# Patient Record
Sex: Female | Born: 1950 | Race: Asian | Hispanic: No | Smoking: Never smoker
Health system: Southern US, Community
[De-identification: ages and names within clinical notes are randomized; demographics above are authoritative.]

## PROBLEM LIST (undated history)

## (undated) DIAGNOSIS — E559 Vitamin D deficiency, unspecified: Secondary | ICD-10-CM

## (undated) DIAGNOSIS — M810 Age-related osteoporosis without current pathological fracture: Secondary | ICD-10-CM

## (undated) DIAGNOSIS — E890 Postprocedural hypothyroidism: Secondary | ICD-10-CM

## (undated) DIAGNOSIS — G43909 Migraine, unspecified, not intractable, without status migrainosus: Secondary | ICD-10-CM

## (undated) DIAGNOSIS — F32A Depression, unspecified: Secondary | ICD-10-CM

## (undated) DIAGNOSIS — J45909 Unspecified asthma, uncomplicated: Secondary | ICD-10-CM

## (undated) DIAGNOSIS — E042 Nontoxic multinodular goiter: Secondary | ICD-10-CM

## (undated) HISTORY — DX: Unspecified asthma, uncomplicated: J45.909

## (undated) HISTORY — DX: Age-related osteoporosis without current pathological fracture: M81.0

## (undated) HISTORY — DX: Nontoxic multinodular goiter: E04.2

## (undated) HISTORY — DX: Vitamin D deficiency, unspecified: E55.9

## (undated) HISTORY — DX: Migraine, unspecified, not intractable, without status migrainosus: G43.909

## (undated) HISTORY — DX: Depression, unspecified: F32.A

## (undated) HISTORY — PX: LAPAROSCOPIC HYSTERECTOMY: SHX1926

## (undated) HISTORY — DX: Postprocedural hypothyroidism: E89.0

---

## 1998-12-09 ENCOUNTER — Encounter: Payer: Self-pay | Admitting: Obstetrics and Gynecology

## 1998-12-09 ENCOUNTER — Ambulatory Visit (HOSPITAL_COMMUNITY): Admission: RE | Admit: 1998-12-09 | Discharge: 1998-12-09 | Payer: Self-pay | Admitting: Obstetrics and Gynecology

## 2000-05-28 ENCOUNTER — Other Ambulatory Visit: Admission: RE | Admit: 2000-05-28 | Discharge: 2000-05-28 | Payer: Self-pay | Admitting: Obstetrics and Gynecology

## 2000-06-04 ENCOUNTER — Ambulatory Visit (HOSPITAL_COMMUNITY): Admission: RE | Admit: 2000-06-04 | Discharge: 2000-06-04 | Payer: Self-pay | Admitting: Obstetrics and Gynecology

## 2000-06-04 ENCOUNTER — Encounter: Payer: Self-pay | Admitting: Obstetrics and Gynecology

## 2001-08-11 ENCOUNTER — Other Ambulatory Visit: Admission: RE | Admit: 2001-08-11 | Discharge: 2001-08-11 | Payer: Self-pay | Admitting: Obstetrics and Gynecology

## 2002-10-12 ENCOUNTER — Other Ambulatory Visit: Admission: RE | Admit: 2002-10-12 | Discharge: 2002-10-12 | Payer: Self-pay | Admitting: Obstetrics and Gynecology

## 2002-10-21 ENCOUNTER — Encounter: Payer: Self-pay | Admitting: Obstetrics and Gynecology

## 2002-10-21 ENCOUNTER — Ambulatory Visit (HOSPITAL_COMMUNITY): Admission: RE | Admit: 2002-10-21 | Discharge: 2002-10-21 | Payer: Self-pay | Admitting: Obstetrics and Gynecology

## 2003-10-18 ENCOUNTER — Other Ambulatory Visit: Admission: RE | Admit: 2003-10-18 | Discharge: 2003-10-18 | Payer: Self-pay | Admitting: Obstetrics and Gynecology

## 2003-11-03 ENCOUNTER — Ambulatory Visit (HOSPITAL_COMMUNITY): Admission: RE | Admit: 2003-11-03 | Discharge: 2003-11-03 | Payer: Self-pay | Admitting: Obstetrics and Gynecology

## 2005-03-01 ENCOUNTER — Other Ambulatory Visit: Admission: RE | Admit: 2005-03-01 | Discharge: 2005-03-01 | Payer: Self-pay | Admitting: Obstetrics and Gynecology

## 2005-03-14 ENCOUNTER — Ambulatory Visit (HOSPITAL_COMMUNITY): Admission: RE | Admit: 2005-03-14 | Discharge: 2005-03-14 | Payer: Self-pay | Admitting: Obstetrics and Gynecology

## 2006-05-09 ENCOUNTER — Other Ambulatory Visit: Admission: RE | Admit: 2006-05-09 | Discharge: 2006-05-09 | Payer: Self-pay | Admitting: Obstetrics and Gynecology

## 2007-07-03 ENCOUNTER — Encounter (INDEPENDENT_AMBULATORY_CARE_PROVIDER_SITE_OTHER): Payer: Self-pay | Admitting: Obstetrics and Gynecology

## 2007-07-03 ENCOUNTER — Ambulatory Visit (HOSPITAL_COMMUNITY): Admission: RE | Admit: 2007-07-03 | Discharge: 2007-07-04 | Payer: Self-pay | Admitting: Obstetrics and Gynecology

## 2008-03-03 ENCOUNTER — Ambulatory Visit (HOSPITAL_COMMUNITY): Admission: RE | Admit: 2008-03-03 | Discharge: 2008-03-03 | Payer: Self-pay | Admitting: Obstetrics and Gynecology

## 2010-02-13 ENCOUNTER — Ambulatory Visit (HOSPITAL_COMMUNITY): Admission: RE | Admit: 2010-02-13 | Discharge: 2010-02-13 | Payer: Self-pay | Admitting: Obstetrics and Gynecology

## 2010-03-20 ENCOUNTER — Encounter: Admission: RE | Admit: 2010-03-20 | Discharge: 2010-03-20 | Payer: Self-pay | Admitting: Obstetrics and Gynecology

## 2011-05-08 NOTE — H&P (Signed)
NAMETOSHIBA, NULL           ACCOUNT NO.:  [id number]   MEDICAL RECORD NO.:  [id number]          PATIENT TYPE:  AMB   LOCATION:                                [facility]   PHYSICIAN:  Hal Morales, M.D.DATE OF BIRTH:  1951/09/29   DATE OF ADMISSION:  DATE OF DISCHARGE:                              HISTORY & PHYSICAL   Gabrielle Day is a 60 year old, married, Asian female, para 2-0-0-2, who  presents for laparoscopically assisted vaginal hysterectomy because of  symptomatic uterine fibroids and menorrhagia. For over a year, the  patient has complained of irregular bleeding which sometimes occurs  every two weeks and lasts for approximately 7 days. This bleeding  requires the change of  a pad five times a day. On occasion, however,  there were episodes that only required the change of 2 panty liners each  day, but that bleeding could last as long as 10 days. All bleeding was  accompanied by cramping which she rates as a 8/10 on a 10-point pain  scale, but she was able to find complete relief with ibuprofen 400 mg.  She denies any dyspareunia, nausea, vomiting, diarrhea, fever or  vaginitis symptoms but does admit to urinary frequency and nocturia. A  sonohysterogram done in June of 2007 showed an anterior fibroid  distorting the endometrium. However, it was not believed to be  submucosal. Additional findings of this study were a uterus measuring  8.83 x 7.33 cm x 5.7 cm with 3 measurable fibroids, 3.88 cm, 3.84 cm,  and 3.88 cm, respectively. The patient's ovaries appeared normal  bilaterally. A TSH and prolactin done at the same time was within normal  limits. Over the past year, the patient was placed on Depo-Provera which  initially controlled her bleeding by making her amenorrheic. After  several months, however, her bleeding resumed its sporadic nature and  flow, though it was much lighter. A hemoglobin in March of 2008 was  12.6. After several conversations on the  medical and surgical management  options for her symptoms to include Mirena IUD, endometrial ablations  and hysterectomy, the patient has chosen to proceed with hysterectomy.   PAST MEDICAL HISTORY:  OB history:  Gravida 2, para 2-0-0-2. The patient  had 2 spontaneous vaginal births.  GYN history:  Menarche 13 years ago, last menstrual period May 26, 2007.  The patient uses vasectomy as a method of contraception. Denies any  history of abnormal Pap smears or sexually transmitted disease. Last  normal Pap smear was May 2008. Last normal mammogram 2006.  Medical history:  Asthma. Migraine headaches.  Surgical history:  Negative. Denies any history of blood transfusions.   FAMILY HISTORY:  Migraine headaches, liver disease and Alzheimer's.   SOCIAL HISTORY:  The patient lives with her husband, and she is a  Fut[employer] trader.   HABITS:  She does not use alcohol or tobacco.   CURRENT MEDICATIONS:  1. Zyrtec 10 mg daily as needed.  2. Tylenol 2 tablets every 4 hours as needed.  3. Multivitamin 1 tablet daily.  4. Advil 2 tablets every 4 hours as needed.  5. Maxair 2  puffs every 6 hours as needed.  6. Calcium with vitamin D 1 tablet daily.   ALLERGIES:  PENICILLIN WHICH CAUSES A RASH.   REVIEW OF SYSTEMS:  The patient does wear glasses. She has seasonal  allergies, but she denies any chest pain, shortness of breath, skin  rashes, night sweats, chills or weight loss, and, accept as mentioned in  history of present illness, the remainder of the review of systems is  negative.   PHYSICAL EXAMINATION:  Blood pressure is 100/62, weight 122, height 5  feet 2-1/4 inches tall, pulse is 70.  NECK:  Is supple without masses. There is no thyromegaly or cervical  adenopathy.  HEART:  Regular rate and rhythm.  LUNGS:  Are clear.  BACK:  No CVA tenderness.  ABDOMEN:  No tenderness, masses or organomegaly.  EXTREMITIES:  No clubbing, cyanosis, or edema.  PELVIC EXAM:  EGBUS is within normal limits.  Vagina is rugose with mild  relaxation. There is brown discharge. Cervix nontender without lesions.  Uterus is retroflexed measuring 10-12 weeks' size, irregular but without  tenderness. Adnexa without tenderness or masses. Rectovaginal exam  without masses.   IMPRESSION:  1. Symptomatic uterine fibroids.  2. Menorrhagia.  3. Dysmenorrhea.   DISPOSITION:  A discussion was held with the patient regarding the  indications for her procedure along with its risks which include but are  not limited to reaction to anesthesia, damage to adjacent organs,  infection, excessive bleeding, and the possibility that her procedure  may require an abdominal incision. The patient verbalized understanding  of these risks and has consented to proceed with a laparoscopically  assisted vaginal hysterectomy with the possibility of a total abdominal  hysterectomy at [facility] [place] [facility] on July 03, 2007 at 10:30  a.m.      [place] J. Adline Peals.      Hal Morales, M.D.  Electronically Signed    EJP/MEDQ  D:  06/19/2007  T:  06/19/2007  Job:  [id number]

## 2011-05-08 NOTE — Discharge Summary (Signed)
Gabrielle Day, Gabrielle Day           ACCOUNT NO.:  [id number]   MEDICAL RECORD NO.:  [id number]          PATIENT TYPE:  OIB   LOCATION:  9303                          [facility]ILITY:  [facility]   PHYSICIAN:  Hal Morales, M.D.DATE OF BIRTH:  12-May-1951   DATE OF ADMISSION:  07/03/2007  DATE OF DISCHARGE:  07/04/2007                               DISCHARGE SUMMARY   ADMISSION DIAGNOSIS:  Menorrhagia, uterine fibroids.   DISCHARGE DIAGNOSES:  Menorrhagia, uterine fibroids.   PROCEDURE:  Laparoscopically-assisted vaginal hysterectomy.   HOSPITAL COURSE:  The patient was admitted to the hospital after having  undergone a laparoscopically-assisted vaginal hysterectomy, the details  of which are dictated in the operative report.  On the evening of  surgery, she was afebrile with adequate urine output and a benign  physical examination.  She was getting adequate relief from her PCA  Dilaudid.  On postoperative day #1, she had the vaginal packing removed  with minimal bleeding.  She began a regular diet and oral pain  medications.  Her postoperative laboratory work on post-op day #1  included a hemoglobin of 11.7 and a white blood cell count of 12.  She  was able to take a regular diet and ambulate in the halls.  She was  afebrile.  She was deemed to have received maximal hospital benefit and  be ready for discharge home.   DISCHARGE INSTRUCTIONS:  Printed instructions from [facility]  OB[facility].   DISCHARGE MEDICATIONS:  1. Ibuprofen 600 mg p.o. q.6 h. for 3 days, then as needed for pain.  2. Percocet 1 p.o. q.4 h. as needed for pain.  3. The patient is to resume her calcium 600 mg and vitamin D 400 mg      twice daily.  4. Zyrtec.  5. Maxair p.r.n.   FOLLOWUP:  The patient has a followup appointment in 6 weeks with Dr.  Pennie Rushing.   CONDITION ON DISCHARGE:  Recovering.      Hal Morales, M.D.  Electronically Signed     VPH/MEDQ  D:  07/04/2007  T:  07/04/2007  Job:   [id number]
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
## 2011-10-09 LAB — CBC
HCT: 40.9
Hemoglobin: 11.7 — ABNORMAL LOW
MCHC: 33.3
MCHC: 33.4
MCV: 89
Platelets: 217
RBC: 4.6
RDW: 12.5
WBC: 12 — ABNORMAL HIGH

## 2013-01-19 ENCOUNTER — Other Ambulatory Visit: Payer: Self-pay | Admitting: Obstetrics and Gynecology

## 2013-01-19 DIAGNOSIS — Z1231 Encounter for screening mammogram for malignant neoplasm of breast: Secondary | ICD-10-CM

## 2013-02-09 ENCOUNTER — Ambulatory Visit (HOSPITAL_COMMUNITY)
Admission: RE | Admit: 2013-02-09 | Discharge: 2013-02-09 | Disposition: A | Discharge status: Home / Self Care | Payer: Federal, State, Local not specified - PPO | Source: Ambulatory Visit | Attending: Obstetrics and Gynecology | Admitting: Obstetrics and Gynecology

## 2013-02-09 DIAGNOSIS — Z1231 Encounter for screening mammogram for malignant neoplasm of breast: Secondary | ICD-10-CM | POA: Insufficient documentation

## 2013-02-19 ENCOUNTER — Encounter: Payer: Self-pay | Admitting: Obstetrics and Gynecology

## 2013-02-19 DIAGNOSIS — Z90710 Acquired absence of both cervix and uterus: Secondary | ICD-10-CM

## 2013-02-19 DIAGNOSIS — R922 Inconclusive mammogram: Secondary | ICD-10-CM

## 2013-02-19 HISTORY — DX: Acquired absence of both cervix and uterus: Z90.710

## 2015-10-14 ENCOUNTER — Other Ambulatory Visit: Payer: Self-pay | Admitting: Obstetrics and Gynecology

## 2015-10-14 ENCOUNTER — Ambulatory Visit
Admission: RE | Admit: 2015-10-14 | Discharge: 2015-10-14 | Disposition: A | Discharge status: Home / Self Care | Payer: Federal, State, Local not specified - PPO | Source: Ambulatory Visit | Attending: Obstetrics and Gynecology | Admitting: Obstetrics and Gynecology

## 2015-10-14 DIAGNOSIS — M810 Age-related osteoporosis without current pathological fracture: Secondary | ICD-10-CM

## 2015-12-20 ENCOUNTER — Ambulatory Visit: Payer: Federal, State, Local not specified - PPO | Attending: Obstetrics and Gynecology | Admitting: Physical Therapy

## 2015-12-20 ENCOUNTER — Ambulatory Visit: Payer: Federal, State, Local not specified - PPO

## 2015-12-20 DIAGNOSIS — R29898 Other symptoms and signs involving the musculoskeletal system: Secondary | ICD-10-CM | POA: Diagnosis present

## 2015-12-20 DIAGNOSIS — M810 Age-related osteoporosis without current pathological fracture: Secondary | ICD-10-CM | POA: Insufficient documentation

## 2015-12-20 NOTE — Patient Instructions (Signed)
Bridging    Slowly raise buttocks from floor, keeping stomach tight.  Hold 5 seconds Repeat __10__ times per set. Do _1___ sets per session. Do _1-2___ sessions per day.  http://orth.exer.us/1097   Copyright  VHI. All rights reserved.    Balance, Proprioception: Hip Abduction With Tubing    With tubing attached to ankle of uninvolved leg, swing leg to side. Return. Repeat __10_ times. Do _1-2___ sessions per day.  http://cc.exer.us/20   Copyright  VHI. All rights reserved.   Balance, Proprioception: Hip Extension With Tubing    With tubing attached to ankle around leg, swing leg back. Return. Repeat _10___ times. Do __1-2__ sessions per day.  http://cc.exer.us/19   Copyright  VHI. All rights reserved.   Balance, Proprioception: Hip Adduction With Tubing    With tubing attached to ankle, cross over. Return. Repeat __10__ times. Do _1-2___ sessions per day.  http://cc.exer.us/21   Copyright  VHI. All rights reserved.   Balance, Proprioception: Hip Flexion With Tubing    With tubing attached to ankle, swing leg forward. Return. Repeat __10__ times. Do _1-2___ sessions per day.  http://cc.exer.us/18   Copyright  VHI. All rights reserved.   Heel Raises    Stand with support. With knees straight, raise heels off ground. Hold _1-2__ seconds. Relax for _1-2__ seconds. Repeat _10__ times. Do __1-2_ times a day.  Copyright  VHI. All rights reserved.       Machines safe to use at the fitness center:  1. Leg Press 2. Hip abduction/adduction machine 3. Hamstring curls 4. Knee extension

## 2015-12-20 NOTE — Therapy (Addendum)
[facility] [address]  [place], [place], [address] Phone: [phone]   Fax:  [phone]  Physical Therapy Evaluation  Patient Details  Name: Gabrielle Day MRN: [id number] Date of Birth: 27-Feb-1951 Referring Provider: Dr. Kendall Flack  Encounter Date: 12/20/2015      PT End of Session - 12/20/15 1046    Visit Number 1   Number of Visits 4   Date for PT Re-Evaluation 01/17/16   PT Start Time 0800   PT Stop Time 0842   PT Time Calculation (min) 42 min   Activity Tolerance Patient tolerated treatment well   Behavior During Therapy [facility] for tasks assessed/performed      No past medical history on file.  No past surgical history on file.  There were no vitals filed for this visit.  Visit Diagnosis:  Weakness of both lower extremities - Plan: PT plan of care cert/re-cert  Osteoporosis - Plan: PT plan of care cert/re-cert      Subjective Assessment - 12/20/15 0802    Subjective Pt is a 63 y/o who presents to OPPT for osteoporosis and need to establish exercises program. Pt currently with limited exercise; but reports trying to walk as much as possible but has been limited due to cold.     Patient Stated Goals establish program and teach pt right way to exercise   Currently in Pain? No/denies  reports occasional R hip pain; will monitor but will not directly address            [facility] PT Assessment - 12/20/15 0806    Assessment   Medical Diagnosis osteoporosis   Referring Provider Dr. Kendall Flack   Onset Date/Surgical Date --  progressive   Next MD Visit PRN   Prior Therapy none   Precautions   Precautions None   Restrictions   Weight Bearing Restrictions No   Balance Screen   Has the patient fallen in the past 6 months No   Has the patient had a decrease in activity level because of a fear of falling?  No   Is the patient reluctant to leave their home because of a fear of falling?   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   Leisure shopping; walking   Cognition   Overall Cognitive Status Within Functional Limits for tasks assessed   Observation/Other Assessments   Focus on Therapeutic Outcomes (FOTO)  not completed as likely 1 time visit   Strength   Strength Assessment Site Hip;Knee;Ankle   Right Hip Flexion 3/5   Right Hip Extension 3/5   Right Hip ABduction 3+/5   Right Hip ADduction 3+/5   Left Hip Flexion 3/5   Left Hip Extension 3/5   Left Hip ABduction 3+/5   Left Hip ADduction 3+/5   Right/Left Knee Right;Left   Right Knee Flexion 4/5   Right Knee Extension 5/5   Left Knee Flexion 4/5   Left Knee Extension 5/5                   [facility] Adult PT Treatment/Exercise - 12/20/15 1042    Self-Care   Other Self-Care Comments  pt performed 1-5 reps of HEP and available fitness equipment to demonstrate and return demonstration of proper technique.  Pt lives in [place], [place] and is returning to [place] today and unsure when she will be returning.   Exercises   Exercises Knee/Hip  Knee/Hip Exercises: Aerobic   Elliptical x 1 min for demonstration of proper use   Knee/Hip Exercises: Machines for Strengthening   Cybex Knee Extension 20# x 5 reps for demonstration   Cybex Knee Flexion 20# x 5 reps for demonstration   Cybex Leg Press 25# x 3 reps for demonstration   Knee/Hip Exercises: Standing   Other Standing Knee Exercises standing hip 4 way with green theraband x 3-5 reps each direction for demonstration                PT Education - 12/20/15 1045    Education provided Yes   Education Details HEP and fitness center equipment recommendations   Person(s) Educated Patient   Methods Explanation;Demonstration;Handout;Verbal cues   Comprehension Verbalized understanding;Returned demonstration;Need further instruction             PT Long Term Goals - 12/20/15 1051    PT LONG TERM GOAL #1   Title  independent with HEP (01/17/16)   Time 4   Period Weeks   Status New   PT LONG TERM GOAL #2   Title report compliance with HEP and fitness center attendance to improve strength (01/17/16)   Time 4   Period Weeks   Status New               Plan - 12/20/15 1046    Clinical Impression Statement Pt is a 64 y/o female who presents to OPPT with dx of osteoporosis.  Pt demonstrated decreased strength in bil lower extremities and concern for injury if pt falls.  Pt lives in [place], [place] with plans to return today.  Pt unsure when she will be returning to [place], so session focused on home exercises and fitness center exercises.  Advised pt to have assistance when using fitness center equipment for first time to decrease risk of injury.  If pt returns will plan to continue strengthening and overall functional mobility.     Pt will benefit from skilled therapeutic intervention in order to improve on the following deficits Decreased strength;Decreased mobility   Rehab Potential Good   PT Frequency 1x / week   PT Duration Other (comment)  pt possibly to return in ~ 4 weeks   PT Treatment/Interventions Therapeutic exercise;Therapeutic activities;ADLs/Self Care Home Management;Balance training;Patient/family education   PT Next Visit Plan progress HEP   Consulted and Agree with Plan of Care Patient         Problem List Patient Active Problem List   Diagnosis Date Noted  . Dense breasts 02/19/2013  . S/P hysterectomy 02/19/2013   Laureen Abrahams, PT, DPT 12/20/2015 10:55 AM  [facility] [address]  [place], [place], [address] Phone: [phone]   Fax:  [phone]  Name: Gabrielle Day MRN: [id number] Date of Birth: 24-Sep-1951     PHYSICAL THERAPY DISCHARGE SUMMARY  Visits from Start of Care: 1  Current functional level related to goals / functional outcomes: See above; pt did not return to PT.  Pt lives  in [place] and was unsure if she would be returning to [facility] during the [place].  Provided pt with referral for option to receive services in [place].   Remaining deficits: Unknown as pt did not return   Education / Equipment: HEP; community fitness  Plan: Patient agrees to discharge.  Patient goals were not met. Patient is being discharged due to not returning since the last visit.  ?????    Laureen Abrahams, PT,  DPT 02/09/2016 9:09 AM  [facility] [place], [place] [address]  [phone] (office) [phone] (fax)

## 2016-01-09 ENCOUNTER — Ambulatory Visit: Payer: Federal, State, Local not specified - PPO

## 2017-04-26 IMAGING — CR DG LUMBAR SPINE COMPLETE 4+V
5 series · 5 of 5 positions shown · non-contrast
Comparison: None.

CLINICAL DATA: Osteoporosis.  Rule out fracture.

EXAM:
LUMBAR SPINE - COMPLETE 4+ VIEW

[w lumbar spine ap]
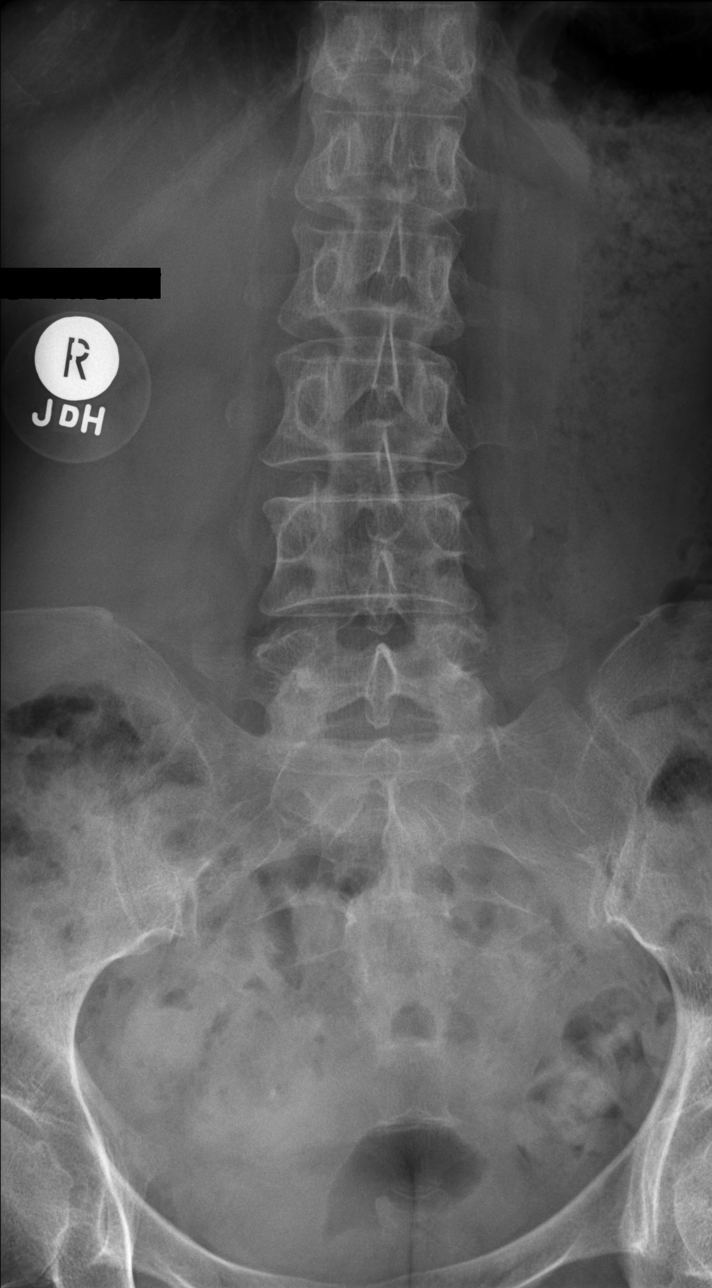

[w lumbar spine obl (1 of 2)]
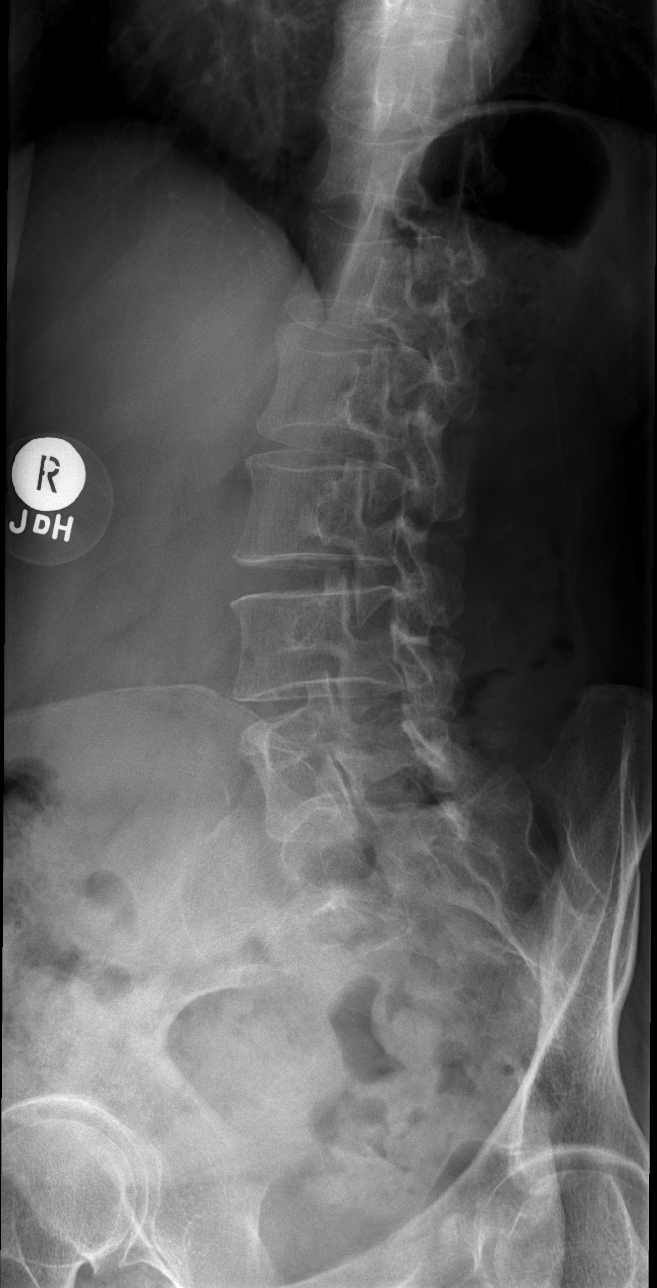

[w lumbar spine obl (2 of 2)]
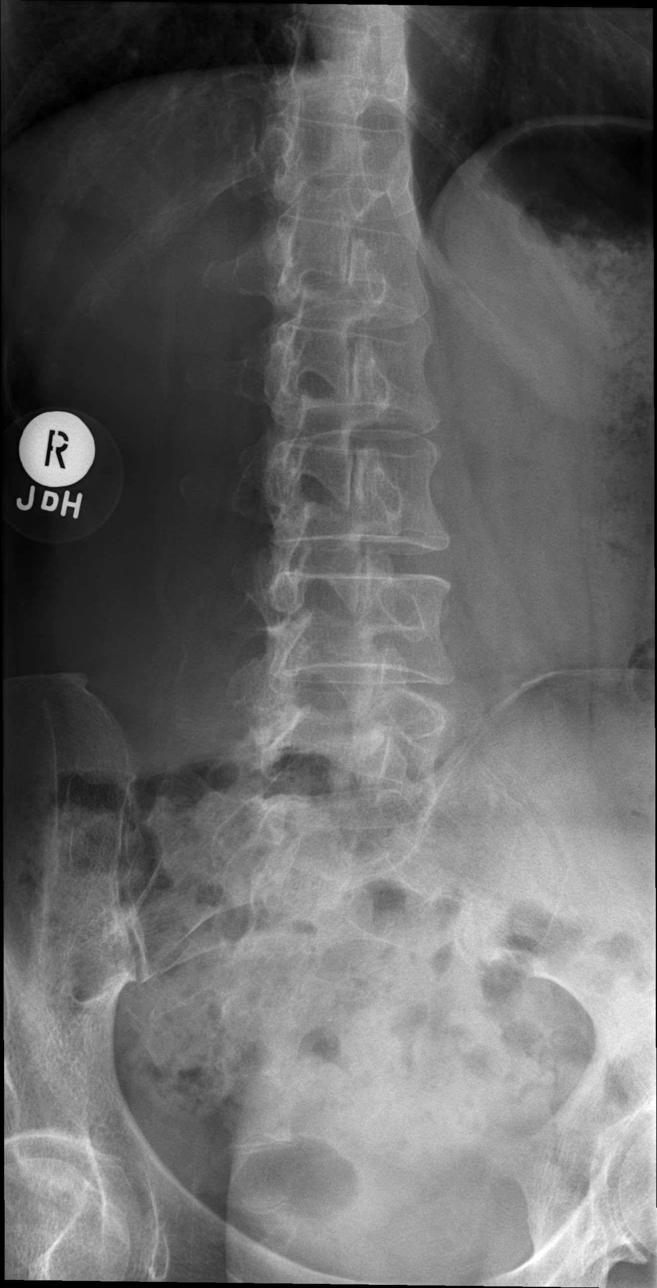

[w lumbar spine lat]
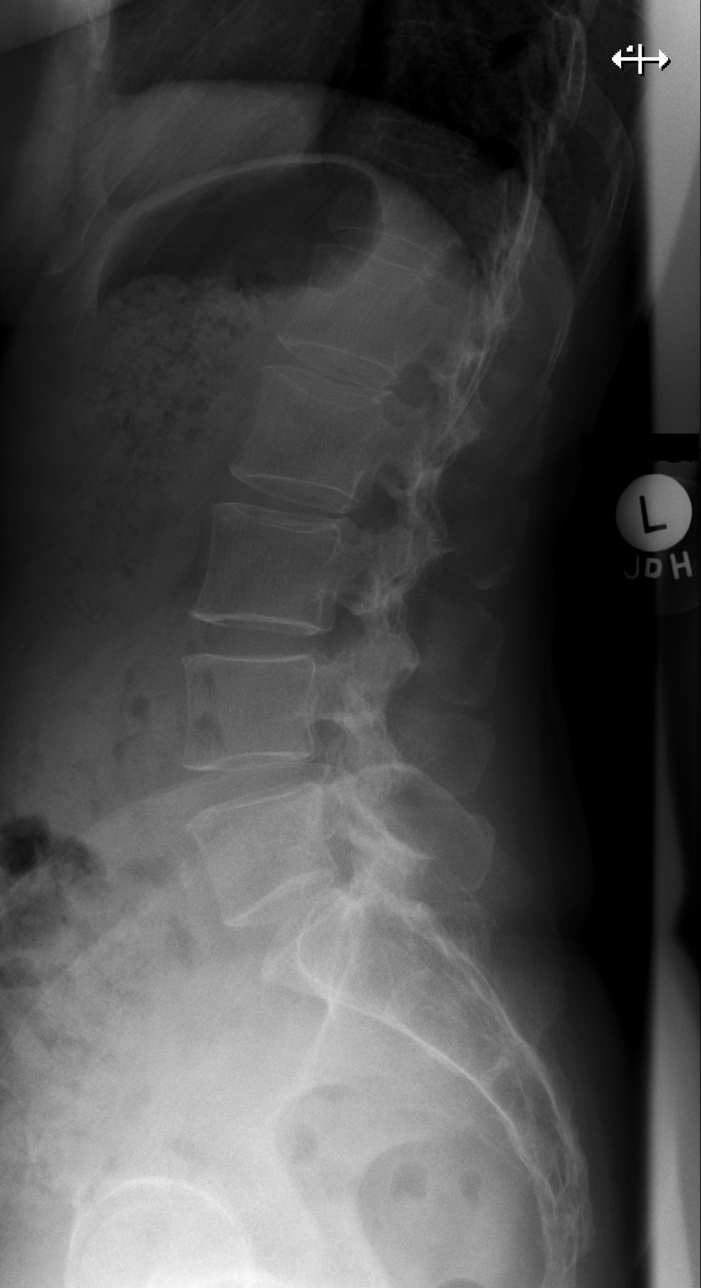

[w lumbar l-5 s-1 spot]
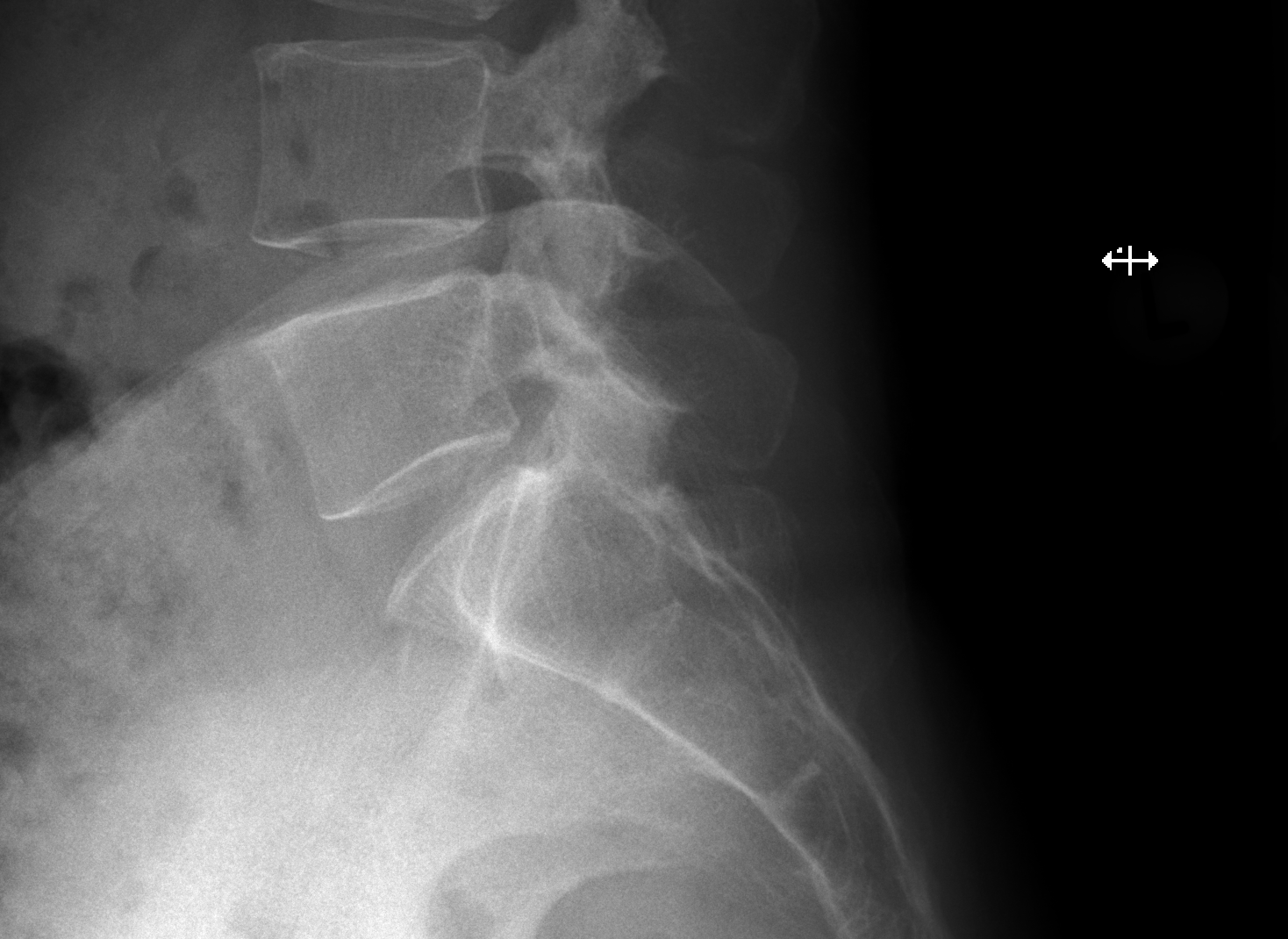

[5 of 5 positions shown; findings below may reference images not displayed]

FINDINGS: There is no evidence of lumbar spine fracture. Alignment is normal.
Intervertebral disc spaces are maintained.
IMPRESSION: Negative.

## 2019-06-02 HISTORY — PX: THYROIDECTOMY, PARTIAL: SHX18

## 2019-11-27 ENCOUNTER — Other Ambulatory Visit: Payer: Self-pay | Admitting: *Deleted

## 2019-11-27 DIAGNOSIS — Z20822 Contact with and (suspected) exposure to covid-19: Secondary | ICD-10-CM

## 2019-11-30 LAB — NOVEL CORONAVIRUS, NAA: SARS-CoV-2, NAA: NOT DETECTED

## 2019-12-25 HISTORY — PX: CATARACT EXTRACTION: SUR2

## 2021-02-20 LAB — HM PAP SMEAR: HM Pap smear: NEGATIVE

## 2021-07-10 LAB — COLOGUARD: Cologuard: NEGATIVE

## 2021-07-14 LAB — COLOGUARD: COLOGUARD: NEGATIVE

## 2022-06-19 ENCOUNTER — Telehealth: Payer: Self-pay | Admitting: Family

## 2022-06-19 ENCOUNTER — Other Ambulatory Visit (INDEPENDENT_AMBULATORY_CARE_PROVIDER_SITE_OTHER): Payer: Federal, State, Local not specified - PPO

## 2022-06-19 ENCOUNTER — Ambulatory Visit: Payer: Federal, State, Local not specified - PPO | Admitting: Family

## 2022-06-19 ENCOUNTER — Encounter: Payer: Self-pay | Admitting: Family

## 2022-06-19 VITALS — BP 123/71 | HR 59 | Temp 97.7°F | Resp 16 | Ht 63.0 in | Wt 115.4 lb

## 2022-06-19 DIAGNOSIS — E890 Postprocedural hypothyroidism: Secondary | ICD-10-CM

## 2022-06-19 DIAGNOSIS — E785 Hyperlipidemia, unspecified: Secondary | ICD-10-CM | POA: Insufficient documentation

## 2022-06-19 DIAGNOSIS — M810 Age-related osteoporosis without current pathological fracture: Secondary | ICD-10-CM | POA: Diagnosis not present

## 2022-06-19 DIAGNOSIS — E559 Vitamin D deficiency, unspecified: Secondary | ICD-10-CM | POA: Diagnosis not present

## 2022-06-19 DIAGNOSIS — J45909 Unspecified asthma, uncomplicated: Secondary | ICD-10-CM | POA: Diagnosis not present

## 2022-06-19 DIAGNOSIS — Z0000 Encounter for general adult medical examination without abnormal findings: Secondary | ICD-10-CM | POA: Insufficient documentation

## 2022-06-19 DIAGNOSIS — F32A Depression, unspecified: Secondary | ICD-10-CM

## 2022-06-19 DIAGNOSIS — G43909 Migraine, unspecified, not intractable, without status migrainosus: Secondary | ICD-10-CM

## 2022-06-19 LAB — LIPID PANEL
Cholesterol: 239 mg/dL — ABNORMAL HIGH (ref 0–200)
HDL: 77.6 mg/dL (ref >39.00)
LDL Cholesterol: 143 mg/dL — ABNORMAL HIGH (ref 0–99)
NonHDL: 161.79
Total CHOL/HDL Ratio: 3
Triglycerides: 95 mg/dL (ref 0.0–149.0)
VLDL: 19 mg/dL (ref 0.0–40.0)

## 2022-06-19 LAB — VITAMIN D 25 HYDROXY (VIT D DEFICIENCY, FRACTURES): VITD: 29.62 ng/mL — ABNORMAL LOW (ref 30.00–100.00)

## 2022-06-19 LAB — TSH: TSH: 2.18 u[IU]/mL (ref 0.35–5.50)

## 2022-06-19 MED ORDER — VITAMIN D3 75 MCG (3000 UT) PO TABS: 1.0000 | ORAL_TABLET | Freq: Every day | ORAL | Status: AC

## 2022-06-19 NOTE — Progress Notes (Signed)
Subjective:   By signing my name below, I, Cassell Clement, attest that this documentation has been prepared under the direction and in the presence of Birdie Sons, NP 06/19/2022    Patient ID: Gabrielle Day, female    DOB: 1951-04-24, 71 y.o.   MRN: [id number]  Chief Complaint  Patient presents with   New Patient (Initial Visit)    Relocated from [facility] 2 months ago    HPI Patient is in today for establishment of patient care.   Thyroid: She reports that she had partial removal of her thyroid. There was a nodule in her thyroid. She had a biopsy on both sides but states that the right side of her thyroid is worse than the left side. She reports that she gets an ultrasound every 6 months. Her last ultrasound was in 09/2021. She is interested in being referred to an endocrinologist in the area Depression: She states that as of this visit, she has no depression symptoms. She reports that at the time of her diagnosis, she was not taking medication Asthma: She states that her asthma worsens in the Autumn. She has an her Albuterol inhaler on her. She uses her 110 MCG/ACT Flovent year around. She takes 10 Mg of Singular PRN.   Urine: She reports that she urinates twice overnight typically.   Migraines: She reports that she occasionally has migraines. She takes tylenol and coffee to help alleviate symptoms. She states that some days she just needs coffee to relieve the symptoms  She denies having any fever, new muscle pain, joint pain , new moles, congestion, sinus pain, sore throat, chest pain, palpations, cough, SOB ,wheezing,n/v/d constipation, blood in stool, dysuria, frequency, hematuria, depression, anxiety, headaches at this time  Surgical History: She reports that she has had cataract surgery and partial removal of her tonsils.  Family History: She reports that her dad has Alzheimer's disease. She also reports that her mother passed but had no health issues. Her sisters and  brothers are relatively healthy. She believes her other brother passed away from a stroke. She states that her daughter is healthy and her son has asthma.  Colonoscopy: She reports that she never had a colonoscopy. She instead uses a Cologuard and reports no immediate symptoms Dexa: She has a history of osteopetrosis and reports that has been a long time she had a bone density exam. She prefers to get her exam done with her specialist Mammogram: She is following up with her mammograms with her gyn.  Immunizations: She reports that she received the Shingles, Pneumonia vaccine and the Bivalent booster vaccine.  Social: She states that she moved here from [place] two months ago. She has previously lived here before moving to [place]. She came back here because this location is her home. Her husband is currently in [place] so they alternate between [employer] and [place]. Both of her children currently live in [place], [place]. She currently has four grandchildren. She is currently not working. She grew up in [place] and moved to the [place] when she was 71 years old. She states her highest level of education is a bachelor's degree in history. She does not currently have any pets.    Health Maintenance Due  Topic Date Due   COVID-19 Vaccine (1) Never done   Hepatitis C Screening  Never done   TETANUS/TDAP  Never done   COLONOSCOPY (Pts 45-44yrs Insurance coverage will need to be confirmed)  Never done   Zoster Vaccines- Shingrix (1 of  2) Never done   MAMMOGRAM  02/09/2015   Pneumonia Vaccine 70+ Years old (1 - PCV) Never done   DEXA SCAN  Never done    Past Medical History:  Diagnosis Date   Asthma    Depression    Migraine    Osteoporosis    Post-surgical hypothyroidism    reports that she had a thyroid nodule   S/P hysterectomy 02/19/2013    Past Surgical History:  Procedure Laterality Date   CATARACT EXTRACTION Bilateral 2021   THYROIDECTOMY, PARTIAL Right 06/02/2019    Family History   Problem Relation Age of Onset   Alzheimer's disease Father    Stroke Brother    Asthma Son     Social History   Socioeconomic History   Marital status: Divorced    Spouse name: Visual merchandiser   Number of children: 2   Years of education: Not on file   Highest education level: Not on file  Occupational History   Not on file  Tobacco Use   Smoking status: Never   Smokeless tobacco: Never  Vaping Use   Vaping Use: Never used  Substance and Sexual Activity   Alcohol use: Never   Drug use: Never   Sexual activity: Yes    Partners: Male  Other Topics Concern   Not on file  Social History Narrative   Married   2 grown children, 4 grandchildren (live in [place])   Futures trader   Enjoys reading   Grew up in [place], moved to [place] at age 28   Bachelor degree in history   Social Determinants of Corporate investment banker Strain: Not on file  Food Insecurity: Not on file  Transportation Needs: Not on file  Physical Activity: Not on file  Stress: Not on file  Social Connections: Not on file  Intimate Partner Violence: Not on file    Outpatient Medications Prior to Visit  Medication Sig Dispense Refill   albuterol (VENTOLIN HFA) 108 (90 Base) MCG/ACT inhaler SMARTSIG:2 Puff(s) By Mouth Every 6 Hours     Azelastine HCl 137 MCG/SPRAY SOLN Place into the nose.     fluticasone (FLOVENT HFA) 110 MCG/ACT inhaler Inhale into the lungs 2 (two) times daily.     levothyroxine (SYNTHROID) 25 MCG tablet Take 25 mcg by mouth daily.     loratadine (CLARITIN) 10 MG tablet Take 10 mg by mouth daily.     montelukast (SINGULAIR) 10 MG tablet Take 10 mg by mouth daily.     No facility-administered medications prior to visit.    Allergies  Allergen Reactions   Penicillins     Review of Systems  Constitutional:  Negative for fever.  HENT:  Negative for congestion, sinus pain and sore throat.   Respiratory:  Negative for wheezing.   Cardiovascular:  Negative for palpitations.  Gastrointestinal:   Negative for blood in stool, constipation, diarrhea, nausea and vomiting.  Genitourinary:  Negative for dysuria, frequency and hematuria.  Musculoskeletal:  Negative for joint pain and myalgias.  Skin:        (-) [place] Moles  Neurological:  Negative for headaches.  Psychiatric/Behavioral:  Negative for depression. The patient is not nervous/anxious.        Objective:    Physical Exam Constitutional:      General: She is not in acute distress.    Appearance: Normal appearance. She is not ill-appearing.  HENT:     Head: Normocephalic and atraumatic.     Right Ear: Tympanic membrane, ear canal  and external ear normal.     Left Ear: Tympanic membrane, ear canal and external ear normal.  Eyes:     Extraocular Movements: Extraocular movements intact.     Pupils: Pupils are equal, round, and reactive to light.  Neck:     Thyroid: No thyromegaly.  Cardiovascular:     Rate and Rhythm: Normal rate and regular rhythm.     Heart sounds: Normal heart sounds. No murmur heard.    No gallop.  Pulmonary:     Effort: Pulmonary effort is normal. No respiratory distress.     Breath sounds: Normal breath sounds. No wheezing or rales.  Abdominal:     General: Bowel sounds are normal. There is no distension.     Palpations: Abdomen is soft.     Tenderness: There is no abdominal tenderness. There is no guarding.  Musculoskeletal:     Comments: 5/5 strength upper and lower extremeties  Lymphadenopathy:     Cervical: No cervical adenopathy.  Skin:    General: Skin is warm and dry.  Neurological:     Mental Status: She is alert and oriented to person, place, and time.     Deep Tendon Reflexes:     Reflex Scores:      Patellar reflexes are 2+ on the right side and 2+ on the left side. Psychiatric:        Mood and Affect: Mood normal.        Behavior: Behavior normal.        Judgment: Judgment normal.     BP 123/71 (BP Location: Right Arm, Patient Position: Sitting, Cuff Size: Small)   Pulse  (!) 59   Temp 97.7 F (36.5 C) (Oral)   Resp 16   Ht 5\' 3"  (1.6 m)   Wt 115 lb 6.4 oz (52.3 kg)   SpO2 100%   BMI 20.44 kg/m  Wt Readings from Last 3 Encounters:  06/19/22 115 lb 6.4 oz (52.3 kg)       Assessment & Plan:   Problem List Items Addressed This Visit       Unprioritized   Vitamin D deficiency    Has taken  2000 iu off/on recently. Requesting follow up vitamin D level.       Relevant Orders   Vitamin D (25 hydroxy)   Preventative health care - Primary    Weight is WNL.  She will do mammo/dexa with GYN.  Reports normal cologuard last year. Reports 5 covid shots as well as shingrix series.  Will request records from her previous PCP.        Post-surgical hypothyroidism    History is unclear, but it sounds like she has hx of nodules/biopsy in the past which led to partial thyroidectomy.  Recommended TSH/Thyroid US to start, she declines, prefers to discuss with endocrinology. Referral placed.       Relevant Medications   levothyroxine (SYNTHROID) 25 MCG tablet   Other Relevant Orders   Ambulatory referral to Endocrinology   Osteoporosis    She is reportedly due for a bone density test. She declines to complete here as she prefers to complete with her GYN, Dr. Pennie Rushing.       Migraine    Stable with prn use of tylenol/caffeine.       Hyperlipidemia    Reports that her cholesterol has been in the 220's typically.  Will check lipid panel today.       Relevant Orders   Lipid panel   Depression  Currently stable. States she has never required medication to treat her depression.       Asthma    Stable. Using flovent/singulair seasonally. Keeps albuterol on hand for prn use.       Relevant Medications   albuterol (VENTOLIN HFA) 108 (90 Base) MCG/ACT inhaler   montelukast (SINGULAIR) 10 MG tablet   fluticasone (FLOVENT HFA) 110 MCG/ACT inhaler     No orders of the defined types were placed in this encounter.   I, Lemont Fillers, NP,  personally preformed the services described in this documentation.  All medical record entries made by the scribe were at my direction and in my presence.  I have reviewed the chart and discharge instructions (if applicable) and agree that the record reflects my personal performance and is accurate and complete. 06/19/2022   I,Amber Collins,acting as a Neurosurgeon for Lemont Fillers, NP.,have documented all relevant documentation on the behalf of Lemont Fillers, NP,as directed by  Lemont Fillers, NP while in the presence of Lemont Fillers, NP.  Lemont Fillers, NP

## 2022-06-19 NOTE — Assessment & Plan Note (Signed)
Currently stable. States she has never required medication to treat her depression.

## 2022-06-19 NOTE — Assessment & Plan Note (Signed)
Has taken  2000 iu off/on recently. Requesting follow up vitamin D level.

## 2022-06-19 NOTE — Assessment & Plan Note (Signed)
She is reportedly due for a bone density test. She declines to complete here as she prefers to complete with her GYN, Dr. Pennie Rushing.

## 2022-06-19 NOTE — Assessment & Plan Note (Signed)
History is unclear, but it sounds like she has hx of nodules/biopsy in the past which led to partial thyroidectomy.  Recommended TSH/Thyroid US to start, she declines, prefers to discuss with endocrinology. Referral placed.

## 2022-06-19 NOTE — Assessment & Plan Note (Signed)
Stable with prn use of tylenol/caffeine.

## 2022-06-19 NOTE — Telephone Encounter (Signed)
TSH was ordered. Refills will be sent when we get results

## 2022-06-20 ENCOUNTER — Other Ambulatory Visit: Payer: Self-pay | Admitting: Family

## 2022-06-20 ENCOUNTER — Other Ambulatory Visit: Payer: Self-pay

## 2022-06-20 MED ORDER — LEVOTHYROXINE SODIUM 25 MCG PO TABS: 25.0000 ug | ORAL_TABLET | Freq: Every day | ORAL | 1 refills | Status: DC

## 2022-06-20 MED ORDER — AZELASTINE HCL 137 MCG/SPRAY NA SOLN: 2.0000 | Freq: Two times a day (BID) | NASAL | 5 refills | Status: DC

## 2022-06-20 MED ORDER — ALBUTEROL SULFATE HFA 108 (90 BASE) MCG/ACT IN AERS: INHALATION_SPRAY | RESPIRATORY_TRACT | 5 refills | Status: DC

## 2022-06-20 MED ORDER — FLUTICASONE PROPIONATE HFA 44 MCG/ACT IN AERO: 2.0000 | INHALATION_SPRAY | Freq: Two times a day (BID) | RESPIRATORY_TRACT | 5 refills | Status: DC

## 2022-06-20 MED ORDER — MONTELUKAST SODIUM 10 MG PO TABS: 10.0000 mg | ORAL_TABLET | Freq: Every day | ORAL | 5 refills | Status: DC

## 2022-06-20 NOTE — Telephone Encounter (Signed)
Patient advised of results, provider's comments and to increase vit d to 3000 iu.

## 2022-06-20 NOTE — Addendum Note (Signed)
Addended by: Sandford Craze on: 06/20/2022 04:08 PM   Modules accepted: Orders

## 2022-07-02 ENCOUNTER — Ambulatory Visit: Payer: Federal, State, Local not specified - PPO | Admitting: Family Medicine

## 2022-07-02 ENCOUNTER — Encounter: Payer: Self-pay | Admitting: Family Medicine

## 2022-07-02 VITALS — BP 110/57 | HR 74 | Temp 98.1°F | Ht 63.0 in | Wt 114.4 lb

## 2022-07-02 DIAGNOSIS — R059 Cough, unspecified: Secondary | ICD-10-CM | POA: Diagnosis not present

## 2022-07-02 DIAGNOSIS — J069 Acute upper respiratory infection, unspecified: Secondary | ICD-10-CM | POA: Diagnosis not present

## 2022-07-02 DIAGNOSIS — J029 Acute pharyngitis, unspecified: Secondary | ICD-10-CM | POA: Diagnosis not present

## 2022-07-02 LAB — POCT RAPID STREP A (OFFICE): Rapid Strep A Screen: NEGATIVE

## 2022-07-02 LAB — POC COVID19 BINAXNOW: SARS Coronavirus 2 Ag: NEGATIVE

## 2022-07-02 MED ORDER — GUAIFENESIN ER 600 MG PO TB12: 1200.0000 mg | ORAL_TABLET | Freq: Two times a day (BID) | ORAL | 2 refills | Status: DC

## 2022-07-02 MED ORDER — BENZONATATE 200 MG PO CAPS: 200.0000 mg | ORAL_CAPSULE | Freq: Two times a day (BID) | ORAL | 0 refills | Status: DC | PRN

## 2022-07-02 MED ORDER — FLUTICASONE PROPIONATE 50 MCG/ACT NA SUSP: 2.0000 | Freq: Every day | NASAL | 6 refills | Status: DC

## 2022-07-02 NOTE — Patient Instructions (Addendum)
COVID and Strep test was negative. This is likely a viral infection. Sending in mucinex, tessalon for cough, and nasal spray. Continue supportive measures including rest, hydration, humidifier use, steam showers, warm compresses to sinuses, warm liquids with lemon and honey, and over-the-counter cough, cold, and analgesics as needed.  If symptoms last 10 days without improving or if you get significantly worse before then, please let me know.   Patient Instructions  Medications & Home Remedies for Upper Respiratory Illness   Note: The following list assumes no pregnancy, normal liver & kidney function and no other drug interactions. All of these may not be appropriate for everyone. Always ask a pharmacist or qualified medical provider if you have any questions!    Aches/Pains, Fever, Headache OTC Acetaminophen (Tylenol) 500 mg tablets - take max 2 tablets (1000 mg) every 6 hours (4 times per day)  OTC Ibuprofen (Motrin) 200 mg tablets - take max 4 tablets (800 mg) every 6 hours   Sinus Congestion OTC Nasal Saline if desired to rinse OTC Oxymetolazone (Afrin, others) sparing use due to rebound congestion, NEVER use in kids. Do not use for more than 3 days.  OTC Phenylephrine (Sudafed) 10 mg tablets every 4 hours (or the 12-hour formulation) OTC Diphenhydramine (Benadryl) 25 mg tablets - take max 2 tablets every 4 hours   Cough & Sore Throat Prescription cough pills or syrups as directed OTC Dextromethorphan (Robitussin, others) - cough suppressant OTC Guaifenesin (Robitussin, Mucinex, others) - expectorant (helps cough up mucus) (Dextromethorphan and Guaifenesin also come in a combination tablet/syrup) OTC Lozenges w/ Benzocaine + Menthol (Cepacol) Honey - as much as you want! Teas which "coat the throat" - look for ingredients Elm Bark, Licorice Root, Marshmallow Root   Other OTC Zinc Lozenges within 24 hours of symptoms onset - mixed evidence this shortens the duration of the common  cold Don't waste your money on Vitamin C or Echinacea in acute illness - these are great for prevention, but once the illness has set in, it's too late!

## 2022-07-02 NOTE — Progress Notes (Signed)
Acute Office Visit  Subjective:     Patient ID: Gabrielle Day, female    DOB: 1951-09-14, 71 y.o.   MRN: [id number]  CC: URI symptoms   URI  This is a new problem. The current episode started in the past 7 days (3-4 days ago). The problem has been unchanged. There has been no fever. Associated symptoms include congestion, coughing, headaches, rhinorrhea, sinus pain, a sore throat and wheezing. Pertinent negatives include no abdominal pain, chest pain, diarrhea, dysuria, ear pain, joint pain, joint swelling, nausea, neck pain, plugged ear sensation, rash, sneezing, swollen glands or vomiting. She has tried inhaler use and antihistamine for the symptoms. The treatment provided mild relief.  Granddaughter has similar symptoms, but also running a fever.  COVID test negative yesterday at home  She is requesting COVID and Strep testing.    ROS: All review of systems negative except what is listed in the HPI       Objective:    BP (!) 110/57   Pulse 74   Temp 98.1 F (36.7 C)   Ht 5\' 3"  (1.6 m)   Wt 114 lb 6.4 oz (51.9 kg)   SpO2 99%   BMI 20.27 kg/m    Physical Exam Vitals reviewed.  Constitutional:      General: She is not in acute distress.    Appearance: Normal appearance. She is normal weight. She is not ill-appearing.  HENT:     Head: Normocephalic and atraumatic.     Right Ear: Tympanic membrane normal.     Left Ear: Tympanic membrane normal.     Nose: Congestion present.     Mouth/Throat:     Mouth: Mucous membranes are moist.     Pharynx: Oropharynx is clear.  Eyes:     Conjunctiva/sclera: Conjunctivae normal.  Cardiovascular:     Rate and Rhythm: Normal rate and regular rhythm.  Pulmonary:     Effort: Pulmonary effort is normal.     Breath sounds: Normal breath sounds. No wheezing, rhonchi or rales.  Musculoskeletal:     Cervical back: Normal range of motion and neck supple. No tenderness.  Lymphadenopathy:     Cervical: No cervical adenopathy.   Skin:    General: Skin is warm and dry.  Neurological:     General: No focal deficit present.     Mental Status: She is alert and oriented to person, place, and time. Mental status is at baseline.  Psychiatric:        Mood and Affect: Mood normal.        Behavior: Behavior normal.        Thought Content: Thought content normal.        Judgment: Judgment normal.     Results for orders placed or performed in visit on 07/02/22  POC COVID-19 BinaxNow  Result Value Ref Range   SARS Coronavirus 2 Ag Negative Negative  POCT rapid strep A  Result Value Ref Range   Rapid Strep A Screen Negative Negative        Assessment & Plan:   Problem List Items Addressed This Visit   None Visit Diagnoses     Sore throat    -  Primary   Relevant Orders   POC COVID-19 BinaxNow (Completed)   POCT rapid strep A (Completed)   Cough, unspecified type       Relevant Orders   POC COVID-19 BinaxNow (Completed)   POCT rapid strep A (Completed)   Viral URI with cough  COVID and Strep test was negative. This is likely a viral infection. Sending in mucinex, tessalon for cough, and nasal spray. Continue supportive measures including rest, hydration, humidifier use, steam showers, warm compresses to sinuses, warm liquids with lemon and honey, and over-the-counter cough, cold, and analgesics as needed.  If symptoms last 10 days without improving or if you get significantly worse before then, please let me know.    Relevant Medications   guaiFENesin (MUCINEX) 600 MG 12 hr tablet   fluticasone (FLONASE) 50 MCG/ACT nasal spray   benzonatate (TESSALON) 200 MG capsule       Meds ordered this encounter  Medications   guaiFENesin (MUCINEX) 600 MG 12 hr tablet    Sig: Take 2 tablets (1,200 mg total) by mouth 2 (two) times daily.    Dispense:  30 tablet    Refill:  2    Order Specific Question:   Supervising Provider    Answer:   Danise Edge A [[id number]]   fluticasone (FLONASE) 50 MCG/ACT nasal spray     Sig: Place 2 sprays into both nostrils daily.    Dispense:  16 g    Refill:  6    Order Specific Question:   Supervising Provider    Answer:   Danise Edge A [[id number]]   benzonatate (TESSALON) 200 MG capsule    Sig: Take 1 capsule (200 mg total) by mouth 2 (two) times daily as needed for cough.    Dispense:  20 capsule    Refill:  0    Order Specific Question:   Supervising Provider    Answer:   Danise Edge A [[id number]]    Return if symptoms worsen or fail to improve.  Clayborne Dana, NP

## 2022-07-04 ENCOUNTER — Ambulatory Visit: Payer: Federal, State, Local not specified - PPO | Admitting: Medical

## 2022-07-04 ENCOUNTER — Encounter: Payer: Self-pay | Admitting: Family

## 2022-07-04 VITALS — BP 120/50 | HR 84 | Temp 97.6°F | Resp 18 | Ht 63.0 in | Wt 114.0 lb

## 2022-07-04 DIAGNOSIS — J40 Bronchitis, not specified as acute or chronic: Secondary | ICD-10-CM | POA: Diagnosis not present

## 2022-07-04 DIAGNOSIS — J0100 Acute maxillary sinusitis, unspecified: Secondary | ICD-10-CM | POA: Diagnosis not present

## 2022-07-04 DIAGNOSIS — R059 Cough, unspecified: Secondary | ICD-10-CM | POA: Diagnosis not present

## 2022-07-04 DIAGNOSIS — J45909 Unspecified asthma, uncomplicated: Secondary | ICD-10-CM

## 2022-07-04 MED ORDER — AZITHROMYCIN 250 MG PO TABS: ORAL_TABLET | ORAL | 0 refills | Status: AC

## 2022-07-04 MED ORDER — PROMETHAZINE-DM 6.25-15 MG/5ML PO SYRP: 5.0000 mL | ORAL_SOLUTION | Freq: Four times a day (QID) | ORAL | 0 refills | Status: DC | PRN

## 2022-07-04 MED ORDER — METHYLPREDNISOLONE 4 MG PO TABS: ORAL_TABLET | ORAL | 0 refills | Status: DC

## 2022-07-04 NOTE — Progress Notes (Signed)
Subjective:    Patient ID: Gabrielle Day, female    DOB: 10-30-1951, 71 y.o.   MRN: [id number]  HPI  Pt in today for persisting cough since Friday per pt and husband. Pt has hx of asthma.   2 days ago saw NP Hyman Hopes.  See hpi and plan below in ".   "URI  This is a new problem. The current episode started in the past 7 days (3-4 days ago). The problem has been unchanged. There has been no fever. Associated symptoms include congestion, coughing, headaches, rhinorrhea, sinus pain, a sore throat and wheezing. Pertinent negatives include no abdominal pain, chest pain, diarrhea, dysuria, ear pain, joint pain, joint swelling, nausea, neck pain, plugged ear sensation, rash, sneezing, swollen glands or vomiting. She has tried inhaler use and antihistamine for the symptoms. The treatment provided mild relief.  Granddaughter has similar symptoms, but also running a fever.  COVID test negative yesterday at home   She is requesting COVID and Strep testing.   Sore throat    -  Primary    Relevant Orders    POC COVID-19 BinaxNow (Completed)    POCT rapid strep A (Completed)    Cough, unspecified type        Relevant Orders    POC COVID-19 BinaxNow (Completed)    POCT rapid strep A (Completed)    Viral URI with cough     COVID and Strep test was negative. This is likely a viral infection. Sending in mucinex, tessalon for cough, and nasal spray. Continue supportive measures including rest, hydration, humidifier use, steam showers, warm compresses to sinuses, warm liquids with lemon and honey, and over-the-counter cough, cold, and analgesics as needed.  If symptoms last 10 days without improving or if you get significantly worse before then, please let me know.     Relevant Medications    guaiFENesin (MUCINEX) 600 MG 12 hr tablet    fluticasone (FLONASE) 50 MCG/ACT nasal spray    benzonatate (TESSALON) 200 MG capsule  "     Pt states lungs feel tight like her asthma. Pt states with  above treatment did not help. In past with asthma and cough would use prednisone.  Pt is not diabetic.  Pt presently tells me cough, pnd and mild sob at night. When she uses inhaler will help. She is coughing up some green/yellow mucus.also some sinus pressure and bilateral mild ear pain.   Review of Systems  Constitutional:  Negative for chills and fatigue.  HENT:  Positive for congestion and sore throat.   Respiratory:  Positive for cough and shortness of breath. Negative for chest tightness and wheezing.   Cardiovascular:  Negative for chest pain and palpitations.  Gastrointestinal:  Negative for abdominal pain.  Musculoskeletal:  Negative for back pain.  Skin:  Negative for rash.  Hematological:  Negative for adenopathy. Does not bruise/bleed easily.  Psychiatric/Behavioral:  Negative for behavioral problems and confusion.     Past Medical History:  Diagnosis Date   Asthma    Depression    Migraine    Osteoporosis    Post-surgical hypothyroidism    S/P hysterectomy 02/19/2013     Social History   Socioeconomic History   Marital status: Divorced    Spouse name: Narong   Number of children: 2   Years of education: Not on file   Highest education level: Not on file  Occupational History   Not on file  Tobacco Use   Smoking status: Never  Smokeless tobacco: Never  Vaping Use   Vaping Use: Never used  Substance and Sexual Activity   Alcohol use: Never   Drug use: Never   Sexual activity: Yes    Partners: Male  Other Topics Concern   Not on file  Social History Narrative   Married   2 grown children, 4 grandchildren (live in [place])   Futures trader   Enjoys reading   Grew up in [place], moved to [place] at age 71   Bachelor degree in history   Social Determinants of Corporate investment banker Strain: Not on file  Food Insecurity: Not on file  Transportation Needs: Not on file  Physical Activity: Not on file  Stress: Not on file  Social Connections: Not on file   Intimate Partner Violence: Not on file    Past Surgical History:  Procedure Laterality Date   CATARACT EXTRACTION Bilateral 2021   THYROIDECTOMY, PARTIAL Right 06/02/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 30 mL 5   benzonatate (TESSALON) 200 MG capsule Take 1 capsule (200 mg total) by mouth 2 (two) times daily as needed for cough. 20 capsule 0   Cholecalciferol (VITAMIN D3) 75 MCG (3000 UT) TABS Take 1 tablet by mouth daily at 6 (six) AM. 30 tablet    fluticasone (FLONASE) 50 MCG/ACT nasal spray Place 2 sprays into both nostrils daily. 16 g 6   fluticasone (FLOVENT HFA) 44 MCG/ACT inhaler Inhale 2 puffs into the lungs 2 (two) times daily. 1 each 5   guaiFENesin (MUCINEX) 600 MG 12 hr tablet Take 2 tablets (1,200 mg total) by mouth 2 (two) times daily. 30 tablet 2   levothyroxine (SYNTHROID) 25 MCG tablet Take 1 tablet (25 mcg total) by mouth daily. 90 tablet 1   loratadine (CLARITIN) 10 MG tablet Take 10 mg by mouth daily.     montelukast (SINGULAIR) 10 MG tablet Take 1 tablet (10 mg total) by mouth at bedtime. 30 tablet 5   No current facility-administered medications on file prior to visit.    BP (!) 120/50   Pulse 84   Temp 97.6 F (36.4 C)   Resp 18   Ht 5\' 3"  (1.6 m)   Wt 114 lb (51.7 kg)   SpO2 94%   BMI 20.19 kg/m        Objective:   Physical Exam  General- No acute distress. Pleasant patient. Neck- Full range of motion, no jvd Lungs- even and unlabored. Mild shallow on ausculation Heart- regular rate and  rhythm. Neurologic- CNII- XII grossly intact.  Heent- maxillary sinus pressure to palpation with pnd. Lower ext- no pedal edema, neg homans signs.      Assessment & Plan:   Patient Instructions  Bronchitis and sinusitis with early probable asthma flare. Cough keeping her up at night.  Rx azithromycin antibiotic ,phenergan DM cough syrup and taper medrol dose course..  DC benzonatate and mucinex. Can continue flonase. Can hold xyzal for next  6 days.  If cough persists despite the above notify us and will get cxr.  Follow up in 7-10 days any lingering symptoms  or sooner if needed        [employer], PA-C

## 2022-07-04 NOTE — Patient Instructions (Addendum)
Bronchitis and sinusitis with early probable asthma flare. Cough keeping her up at night.  Rx azithromycin antibiotic ,phenergan DM cough syrup and taper medrol dose course..  DC benzonatate and mucinex. Can continue flonase. Can hold xyzal for next 6 days.  If cough persists despite the above notify us and will get cxr.  Follow up in 7-10 days any lingering symptoms  or sooner if needed

## 2022-07-27 ENCOUNTER — Encounter: Payer: Self-pay | Admitting: Family

## 2022-08-02 LAB — HM MAMMOGRAPHY

## 2022-11-02 ENCOUNTER — Telehealth (INDEPENDENT_AMBULATORY_CARE_PROVIDER_SITE_OTHER): Payer: Federal, State, Local not specified - PPO | Admitting: Internal Medicine

## 2022-11-02 ENCOUNTER — Encounter: Payer: Self-pay | Admitting: Internal Medicine

## 2022-11-02 VITALS — BP 125/73 | HR 61 | Temp 99.0°F | Ht 63.0 in | Wt 120.0 lb

## 2022-11-02 DIAGNOSIS — U071 COVID-19: Secondary | ICD-10-CM

## 2022-11-02 MED ORDER — MOLNUPIRAVIR EUA 200MG CAPSULE: 4.0000 | ORAL_CAPSULE | Freq: Two times a day (BID) | ORAL | 0 refills | Status: AC

## 2022-11-02 NOTE — Progress Notes (Unsigned)
Subjective:    Patient ID: Gabrielle Day, female    DOB: 03-02-1951, 71 y.o.   MRN: [id number]  DOS:  11/02/2022 Type of visit - description: Virtual Visit via Telephone    I connected with above mentioned patient  by telephone and verified that I am speaking with the correct person using two identifiers.  THIS ENCOUNTER IS A VIRTUAL VISIT DUE TO COVID-19 - PATIENT WAS NOT SEEN IN THE OFFICE. PATIENT HAS CONSENTED TO VIRTUAL VISIT / TELEMEDICINE VISIT   Location of patient: home  Location of provider: office  Persons participating in the virtual visit: patient, provider   I discussed the limitations, risks, security and privacy concerns of performing an evaluation and management service by telephone and the availability of in person appointments. I also discussed with the patient that there may be a patient responsible charge related to this service. The patient expressed understanding and agreed to proceed.  Acute: Symptoms started yesterday: Sore throat, body aches, headache, runny nose. She took 2 COVID test and both came back +.  No fever chills No nausea or vomiting History of asthma, she is coughing more than usual but no wheezing at this point. No chest pain no difficulty breathing.      I discussed the assessment and treatment plan with the patient. The patient was provided an opportunity to ask questions and all were answered. The patient agreed with the plan and demonstrated an understanding of the instructions.   The patient was advised to call back or seek an in-person evaluation if the symptoms worsen or if the condition fails to improve as anticipated.  I provided *** minutes of non-face-to-face time during this encounter.  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    Current Outpatient Medications  Medication Instructions   albuterol (VENTOLIN HFA) 108 (90 Base) MCG/ACT inhaler SMARTSIG:2 Puff(s) By Mouth Every 6 Hours   Azelastine HCl 137 MCG/SPRAY SOLN 2 sprays, Nasal, 2 times daily   Cholecalciferol (VITAMIN D3) 75 MCG (3000 UT) TABS 1 tablet, Oral, Daily   fluticasone (FLONASE) 50 MCG/ACT nasal spray 2 sprays, Each Nare, Daily   fluticasone (FLOVENT HFA) 44 MCG/ACT inhaler 2 puffs, Inhalation, 2 times daily   levocetirizine (XYZAL) 5 mg, Oral, Every evening   levothyroxine (SYNTHROID) 25 mcg, Oral, Daily   loratadine (CLARITIN) 10 mg, Daily   methylPREDNISolone (MEDROL) 4 MG tablet Standard 6 day taper dose   montelukast (SINGULAIR) 10 mg, Oral, Daily at bedtime   promethazine-dextromethorphan (PROMETHAZINE-DM) 6.25-15 MG/5ML syrup 5 mLs, Oral, 4 times daily PRN       Objective:   Physical Exam BP 125/73   Pulse 61   Temp 99 F (37.2 C) (Oral)   Ht 5\' 3"  (1.6 m)   Wt 120 lb (54.4 kg)   BMI 21.26 kg/m  This is a telephone visit, she sounded well, in no respiratory distress that I can tell.  The husband also participated in the phone call.     Assessment  71 year old female, PMH includes asthma, depression, goiter, osteoporosis, vitamin D deficiency, most recent: Vaccine 12-2021, presents with  COVID 19: Symptoms started yesterday, + test x2. Does not seem to be toxic or very ill. Plan: Rest, fluids, Tylenol 500 mg 2 tablets 3 times daily as needed Molnupiravir. All of the above discussed with the patient and her husband who was in the call.  They verbalized understanding.  I explained the difference between Paxlovid molnupiravir but at this point due to side effect profile electing molnupiravir. They know to  reach for help if there is no gradual improvement. She will be contagious for 7 days, recommend isolation and precautions.

## 2022-11-19 ENCOUNTER — Telehealth: Payer: Self-pay

## 2022-11-19 NOTE — Telephone Encounter (Signed)
Lab letter created

## 2022-11-21 ENCOUNTER — Ambulatory Visit: Payer: Federal, State, Local not specified - PPO | Admitting: Medical

## 2022-11-21 ENCOUNTER — Ambulatory Visit (HOSPITAL_BASED_OUTPATIENT_CLINIC_OR_DEPARTMENT_OTHER)
Admission: RE | Admit: 2022-11-21 | Discharge: 2022-11-21 | Disposition: A | Discharge status: Home / Self Care | Payer: Federal, State, Local not specified - PPO | Source: Ambulatory Visit | Attending: Medical | Admitting: Medical

## 2022-11-21 VITALS — BP 117/70 | HR 77 | Temp 97.8°F | Resp 18 | Ht 63.0 in | Wt 116.4 lb

## 2022-11-21 DIAGNOSIS — Z8616 Personal history of COVID-19: Secondary | ICD-10-CM | POA: Diagnosis present

## 2022-11-21 DIAGNOSIS — E785 Hyperlipidemia, unspecified: Secondary | ICD-10-CM | POA: Diagnosis not present

## 2022-11-21 DIAGNOSIS — R059 Cough, unspecified: Secondary | ICD-10-CM

## 2022-11-21 DIAGNOSIS — J069 Acute upper respiratory infection, unspecified: Secondary | ICD-10-CM

## 2022-11-21 DIAGNOSIS — R82998 Other abnormal findings in urine: Secondary | ICD-10-CM | POA: Diagnosis not present

## 2022-11-21 LAB — POC URINALSYSI DIPSTICK (AUTOMATED)
Bilirubin, UA: NEGATIVE
Blood, UA: NEGATIVE
Glucose, UA: NEGATIVE
Ketones, UA: NEGATIVE
Leukocytes, UA: NEGATIVE
Nitrite, UA: NEGATIVE
Protein, UA: NEGATIVE
Spec Grav, UA: <=1.005 — AB (ref 1.010–1.025)
Urobilinogen, UA: 0.2 E.U./dL
pH, UA: 6.5 (ref 5.0–8.0)

## 2022-11-21 LAB — POC COVID19 BINAXNOW: SARS Coronavirus 2 Ag: NEGATIVE

## 2022-11-21 MED ORDER — FLUTICASONE PROPIONATE 50 MCG/ACT NA SUSP: 2.0000 | Freq: Every day | NASAL | 6 refills | Status: DC

## 2022-11-21 MED ORDER — MONTELUKAST SODIUM 10 MG PO TABS: 10.0000 mg | ORAL_TABLET | Freq: Every day | ORAL | 5 refills | Status: DC

## 2022-11-21 MED ORDER — LEVOTHYROXINE SODIUM 25 MCG PO TABS: 25.0000 ug | ORAL_TABLET | Freq: Every day | ORAL | 1 refills | Status: DC

## 2022-11-21 MED ORDER — ALBUTEROL SULFATE HFA 108 (90 BASE) MCG/ACT IN AERS: 2.0000 | INHALATION_SPRAY | Freq: Four times a day (QID) | RESPIRATORY_TRACT | 0 refills | Status: DC | PRN

## 2022-11-21 NOTE — Patient Instructions (Addendum)
Recent COVID infection early in the month with initial positive test diagnosis.  Then 1 week later negative test but then tested third time showing positive last week.  Minimal symptoms presently dry cough early in the morning.  Minimal faint wheeze at times.  We will get a chest x-ray today and a rapid COVID test.  Discussed getting a PCR test in the and we decided to do just rapid test.  Early on did get treated with molnupiravir.   Rapid test negatve  Foamy urine recently.  Will get urine POCT and metabolic panel to check kidney function.  History of asthma with mild cough and faint minimal wheeze at best.  Lungs clear on exam.  Will follow chest x-ray.  Refilled albuterol inhaler.  Hypothyroid-TSH normal on lab review.  Refilled thyroid medication same dose.  Follow-up date to be determined after lab review.

## 2022-11-21 NOTE — Progress Notes (Signed)
Subjective:    Patient ID: Gabrielle Day, female    DOB: [birth year], 71 y.o.   MRN: [id number]  HPI Pt in for evaluation.  Pt states she had covid on 10-30-2022 test +. She re-tested on negative week or so later. Then later checked a week later tested + last thursday  Pt did take molnupiravir early on.  Pt is still having dry cough and clear mucus in am. Pt has some concern since she is wheezing minimally.   Some foamy urine at times.     Review of Systems  Constitutional:  Negative for chills, fatigue and fever.  Respiratory:  Positive for cough. Negative for choking and wheezing.   Cardiovascular:  Negative for chest pain and palpitations.  Gastrointestinal:  Negative for abdominal pain, anal bleeding, constipation and nausea.  Musculoskeletal:  Negative for back pain.  Neurological:  Negative for dizziness, numbness and headaches.  Hematological:  Negative for adenopathy. Does not bruise/bleed easily.    Past Medical History:  Diagnosis Date   Asthma    Depression    Migraine    Multinodular goiter    +hx of hurtle cell lesion   Osteoporosis    Post-surgical hypothyroidism    S/P hysterectomy 02/19/2013   Vitamin D deficiency      Social History   Socioeconomic History   Marital status: Divorced    Spouse name: Visual merchandiser   Number of children: 2   Years of education: Not on file   Highest education level: Not on file  Occupational History   Not on file  Tobacco Use   Smoking status: Never   Smokeless tobacco: Never  Vaping Use   Vaping Use: Never used  Substance and Sexual Activity   Alcohol use: Never   Drug use: Never   Sexual activity: Yes    Partners: Male  Other Topics Concern   Not on file  Social History Narrative   Married   2 grown children, 4 grandchildren (live in [place])   Futures trader   Enjoys reading   Grew up in [place], moved to [place] at age 71   Bachelor degree in history   Social Determinants of Corporate investment banker Strain:  Not on file  Food Insecurity: Not on file  Transportation Needs: Not on file  Physical Activity: Not on file  Stress: Not on file  Social Connections: Not on file  Intimate Partner Violence: Not on file    Past Surgical History:  Procedure Laterality Date   CATARACT EXTRACTION Bilateral 2021   THYROIDECTOMY, PARTIAL Right 06/02/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 30 mL 5   Cholecalciferol (VITAMIN D3) 75 MCG (3000 UT) TABS Take 1 tablet by mouth daily at 6 (six) AM. 30 tablet    fluticasone (FLONASE) 50 MCG/ACT nasal spray Place 2 sprays into both nostrils daily. 16 g 6   fluticasone (FLOVENT HFA) 44 MCG/ACT inhaler Inhale 2 puffs into the lungs 2 (two)  times daily. 1 each 5   levocetirizine (XYZAL) 5 MG tablet Take 5 mg by mouth every evening.     levothyroxine (SYNTHROID) 25 MCG tablet Take 1 tablet (25 mcg total) by mouth daily. 90 tablet 1   montelukast (SINGULAIR) 10 MG tablet Take 1 tablet (10 mg total) by mouth at bedtime. 30 tablet 5   No current facility-administered medications on file prior to visit.    BP 117/70   Pulse 77   Temp 97.8 F (36.6 C)   Resp 18   Ht 5\' 3"  (1.6 m)   Wt 116 lb 6.4 oz (52.8 kg)   SpO2 98%   BMI 20.62 kg/m        Objective:   Physical Exam  General- No acute distress. Pleasant patient. Neck- Full range of motion, no jvd Lungs- Clear, even and unlabored. Heart- regular rate and rhythm. Neurologic- CNII- XII grossly intact.  Lower ext- negative homans signs. No edema. Calf  symmetric.    Assessment & Plan:   Patient Instructions  Recent COVID infection early in the month with initial positive test diagnosis.  Then 1 week later negative test but then tested third time showing positive last week.  Minimal symptoms presently dry cough early in the morning.  Minimal faint wheeze at times.  We will get a chest x-ray today and a rapid COVID test.  Discussed getting a PCR test in the and we decided to do just rapid test.  Early on did get treated with molnupiravir.   Rapid test negatve  Foamy urine recently.  Will get urine POCT and metabolic panel to check kidney function.  History of asthma with mild cough and faint minimal wheeze at best.  Lungs clear on exam.  Will follow chest x-ray.  Refilled albuterol inhaler.  Hypothyroid-TSH normal on lab review.  Refilled thyroid medication same dose.  Follow-up date to be determined after lab review.   , PA-C

## 2022-11-22 LAB — COMPREHENSIVE METABOLIC PANEL
ALT: 14 U/L (ref 0–35)
AST: 18 U/L (ref 0–37)
Albumin: 4.6 g/dL (ref 3.5–5.2)
Alkaline Phosphatase: 75 U/L (ref 39–117)
BUN: 10 mg/dL (ref 6–23)
CO2: 31 mEq/L (ref 19–32)
Calcium: 9.5 mg/dL (ref 8.4–10.5)
Chloride: 100 mEq/L (ref 96–112)
Creatinine, Ser: 0.56 mg/dL (ref 0.40–1.20)
GFR: 93.15 mL/min (ref >60.00)
Glucose, Bld: 86 mg/dL (ref 70–99)
Potassium: 4.4 mEq/L (ref 3.5–5.1)
Sodium: 138 mEq/L (ref 135–145)
Total Bilirubin: 0.4 mg/dL (ref 0.2–1.2)
Total Protein: 7.4 g/dL (ref 6.0–8.3)

## 2022-12-21 ENCOUNTER — Telehealth: Payer: Self-pay | Admitting: Family

## 2022-12-21 DIAGNOSIS — E890 Postprocedural hypothyroidism: Secondary | ICD-10-CM

## 2022-12-21 NOTE — Telephone Encounter (Signed)
Referral was re-entered for post surgical hypothyroidism. Husband notified

## 2022-12-21 NOTE — Telephone Encounter (Signed)
Pt spouse Mr Golinski, states pt is needing the referral again for Endocrinology, pt had a referral from PCP since June but the referral was canceled. Pt would like the referral to be put again to [facility] at [facility]. Please advise. Pt tel for spouse Tel [phone]

## 2023-01-01 ENCOUNTER — Ambulatory Visit (HOSPITAL_BASED_OUTPATIENT_CLINIC_OR_DEPARTMENT_OTHER)
Admission: RE | Admit: 2023-01-01 | Discharge: 2023-01-01 | Disposition: A | Discharge status: Home / Self Care | Payer: Federal, State, Local not specified - PPO | Source: Ambulatory Visit | Attending: Family | Admitting: Family

## 2023-01-01 ENCOUNTER — Telehealth: Payer: Self-pay | Admitting: Family

## 2023-01-01 ENCOUNTER — Ambulatory Visit: Payer: Federal, State, Local not specified - PPO | Admitting: Family

## 2023-01-01 ENCOUNTER — Encounter: Payer: Self-pay | Admitting: Family

## 2023-01-01 ENCOUNTER — Other Ambulatory Visit (HOSPITAL_BASED_OUTPATIENT_CLINIC_OR_DEPARTMENT_OTHER): Payer: Federal, State, Local not specified - PPO

## 2023-01-01 VITALS — BP 135/73 | HR 64 | Temp 97.5°F | Resp 16 | Wt 115.0 lb

## 2023-01-01 DIAGNOSIS — E890 Postprocedural hypothyroidism: Secondary | ICD-10-CM | POA: Insufficient documentation

## 2023-01-01 DIAGNOSIS — M810 Age-related osteoporosis without current pathological fracture: Secondary | ICD-10-CM

## 2023-01-01 DIAGNOSIS — E785 Hyperlipidemia, unspecified: Secondary | ICD-10-CM | POA: Diagnosis not present

## 2023-01-01 DIAGNOSIS — Z1159 Encounter for screening for other viral diseases: Secondary | ICD-10-CM

## 2023-01-01 DIAGNOSIS — E042 Nontoxic multinodular goiter: Secondary | ICD-10-CM | POA: Insufficient documentation

## 2023-01-01 DIAGNOSIS — E559 Vitamin D deficiency, unspecified: Secondary | ICD-10-CM | POA: Diagnosis not present

## 2023-01-01 DIAGNOSIS — J069 Acute upper respiratory infection, unspecified: Secondary | ICD-10-CM

## 2023-01-01 DIAGNOSIS — M899 Disorder of bone, unspecified: Secondary | ICD-10-CM | POA: Insufficient documentation

## 2023-01-01 DIAGNOSIS — M8580 Other specified disorders of bone density and structure, unspecified site: Secondary | ICD-10-CM

## 2023-01-01 LAB — VITAMIN D 25 HYDROXY (VIT D DEFICIENCY, FRACTURES): VITD: 27.32 ng/mL — ABNORMAL LOW (ref 30.00–100.00)

## 2023-01-01 LAB — LIPID PANEL
Cholesterol: 225 mg/dL — ABNORMAL HIGH (ref 0–200)
HDL: 77.1 mg/dL (ref >39.00)
LDL Cholesterol: 134 mg/dL — ABNORMAL HIGH (ref 0–99)
NonHDL: 148.11
Total CHOL/HDL Ratio: 3
Triglycerides: 71 mg/dL (ref 0.0–149.0)
VLDL: 14.2 mg/dL (ref 0.0–40.0)

## 2023-01-01 LAB — TSH: TSH: 3.08 u[IU]/mL (ref 0.35–5.50)

## 2023-01-01 MED ORDER — MONTELUKAST SODIUM 10 MG PO TABS: 10.0000 mg | ORAL_TABLET | Freq: Every day | ORAL | 1 refills | Status: DC

## 2023-01-01 MED ORDER — LEVOTHYROXINE SODIUM 25 MCG PO TABS: 25.0000 ug | ORAL_TABLET | Freq: Every day | ORAL | 1 refills | Status: DC

## 2023-01-01 MED ORDER — FLUTICASONE PROPIONATE 50 MCG/ACT NA SUSP: 2.0000 | Freq: Every day | NASAL | 6 refills | Status: DC

## 2023-01-01 MED ORDER — ALBUTEROL SULFATE HFA 108 (90 BASE) MCG/ACT IN AERS: 2.0000 | INHALATION_SPRAY | Freq: Four times a day (QID) | RESPIRATORY_TRACT | 3 refills | Status: DC | PRN

## 2023-01-01 NOTE — Telephone Encounter (Signed)
Called but no answer, left voicemail.

## 2023-01-01 NOTE — Telephone Encounter (Signed)
Pt called back. °

## 2023-01-01 NOTE — Telephone Encounter (Signed)
Please request mammogram report from Dr. Leo Grosser.

## 2023-01-01 NOTE — Assessment & Plan Note (Signed)
Will update thyroid US and plan referral to Dr. Iran Plan[facility] for hopefully a sooner appointment.

## 2023-01-01 NOTE — Assessment & Plan Note (Signed)
Patient is taking vit D supplement 2-3 times a week.

## 2023-01-01 NOTE — Assessment & Plan Note (Signed)
Clinically stable on synthroid. Obtain follow up tsh. 

## 2023-01-01 NOTE — Telephone Encounter (Signed)
Spoke to patient and provided with phone number to radiology

## 2023-01-01 NOTE — Telephone Encounter (Signed)
Records release faxed 

## 2023-01-01 NOTE — Progress Notes (Signed)
Subjective:   By signing my name below, I, Shehryar Baig, attest that this documentation has been prepared under the direction and in the presence of Sandford Craze, NP. 01/01/2023   Patient ID: Gabrielle Day, female    DOB: 09-09-1951, 72 y.o.   MRN: [id number]  Chief Complaint  Patient presents with   Hypothyroidism    Here for follow up    HPI Patient is in today for a follow up visit.   Vitamin D: Her vitamin D levels were low during her last blood work. She is taking 2000 unit vitamin D 2x weekly.   Cholesterol: Her cholesterol was elevated during her last blood work. Lab Results  Component Value Date   CHOL 239 (H) 06/19/2022   HDL 77.60 06/19/2022   LDLCALC 143 (H) 06/19/2022   TRIG 95.0 06/19/2022   CHOLHDL 3 06/19/2022   Thyroid: She states she had trouble getting in with endocrinology. They finally gave her a date for the end of September. She is requesting if she can see another provider sooner. She continues taking 25 mcg synthroid and is requesting a refill on it as well.  Lab Results  Component Value Date   TSH 2.18 06/19/2022   Hepatitis C screening: She is willing to include hepatitis C screening to her next blood work.   Covid-19: She reports contracting Covid-19 on November 2023. She is interested in taking the latest Covid-19 booster vaccine at a later date.   GYN: She continues following up with her GYN specialist regularly. She reports having a laparoscopic hysterectomy. She still has her ovaries intact.   Mammogram: She is UTD on mammogram.   Bone Density: She is due for bone density. She reports having mild bone thinning during her last bone density.   Asthma: She occasionally uses her albuterol when her SOB worsens. She continues using Flovent regularly. She continues using Singulair and reports it helps her symptoms. She is requesting a refill on it as well. She is also requesting a refill on albuterol.   Flonase: She is requesting a refill  on Flonase.   Immunizations: She reports being UTD on the shingles vaccines. She reports being UTD on the RSV vaccine and received it in December 2023. She is UTD on pneumonia vaccine. She is UTD on flu vaccine this year. She is due for the tetanus vaccine and is interested in receiving it during this visit.    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    Family History  Problem Relation Age of Onset   Alzheimer's disease Father    Stroke Brother    Asthma Son     Social History   Socioeconomic History   Marital status: Divorced    Spouse name: Visual merchandiser   Number of children: 2   Years of education: Not on file   Highest education level: Not on file  Occupational History   Not on file  Tobacco Use   Smoking status: Never   Smokeless tobacco: Never  Vaping Use   Vaping Use: Never used  Substance and Sexual Activity   Alcohol use: Never   Drug use: Never   Sexual activity: Yes    Partners: Male  Other Topics Concern   Not on file  Social History Narrative   Married   2 grown children, 4 grandchildren (live in [place])   Agricultural engineer   Enjoys reading   [place] up in [place], moved to [place] at age 55   Bachelor degree in history   Social Determinants of Radio broadcast assistant Strain: Not on [employer] Insecurity: Not on file  Transportation Needs: Not on file  Physical Activity: Not on file  Stress: Not on file  Social Connections: Not on file  Intimate Partner Violence: Not on file    Outpatient Medications Prior to Visit  Medication Sig Dispense Refill   Azelastine HCl 137 MCG/SPRAY  SOLN Place 2 sprays into the nose in the morning and at bedtime. 30 mL 5   Cholecalciferol (VITAMIN D3) 75 MCG (3000 UT) TABS Take 1 tablet by mouth daily at 6 (six) AM. 30 tablet    fluticasone (FLOVENT HFA) 44 MCG/ACT inhaler Inhale 2 puffs into the lungs 2 (two) times daily. 1 each 5   levocetirizine (XYZAL) 5 MG tablet Take 5 mg by mouth every evening.     albuterol (VENTOLIN HFA) 108 (90 Base) MCG/ACT inhaler SMARTSIG:2 Puff(s) By Mouth Every 6 Hours 8 g 5   albuterol (VENTOLIN HFA) 108 (90 Base) MCG/ACT inhaler Inhale 2 puffs into the lungs every 6 (six) hours as needed. 18 g 0   fluticasone (FLONASE) 50 MCG/ACT nasal spray Place 2 sprays into both nostrils daily. 16 g 6   levothyroxine (SYNTHROID) 25 MCG tablet Take 1 tablet (25 mcg total) by mouth daily. 90 tablet 1   montelukast (SINGULAIR) 10 MG tablet Take 1 tablet (10 mg total) by mouth at bedtime. 30 tablet 5   No facility-administered medications prior to visit.    Allergies  Allergen Reactions   Penicillins     ROS    See HPI Objective:    Physical Exam Constitutional:      General: She is not in acute distress.    Appearance: Normal appearance. She is not ill-appearing.  HENT:     Head: Normocephalic and atraumatic.     Right Ear: External ear normal.     Left Ear: External ear normal.  Eyes:     Extraocular Movements: Extraocular movements intact.     Pupils: Pupils are equal, round, and reactive to light.  Neck:     Thyroid: No thyroid mass or thyromegaly.  Cardiovascular:     Rate and Rhythm: Normal rate and regular rhythm.     Heart sounds: Normal heart sounds. No murmur heard.    No gallop.  Pulmonary:     Effort: Pulmonary effort is normal. No respiratory distress.     Breath sounds: Normal breath sounds. No wheezing or rales.  Skin:    General: Skin is warm and dry.  Neurological:     Mental Status: She is alert and oriented to person, place, and time.  Psychiatric:        Judgment: Judgment  normal.     BP 135/73 (BP Location: Right Arm, Patient Position: Sitting, Cuff Size: Small)   Pulse 64   Temp (!) 97.5 F (36.4 C) (Oral)   Resp 16   Wt 115 lb (52.2 kg)   SpO2 100%   BMI 20.37 kg/m  Wt Readings from Last 3 Encounters:  01/01/23 115 lb (52.2 kg)  11/21/22 116 lb 6.4 oz (52.8 kg)  11/02/22 120 lb (54.4 kg)       Assessment & Plan:  Vitamin D deficiency Assessment & Plan: Patient is taking vit D supplement 2-3 times a week.   Orders: -     VITAMIN D 25 Hydroxy (Vit-D Deficiency, Fractures)  Hyperlipidemia, unspecified hyperlipidemia type -     Lipid panel  Encounter for hepatitis C screening test for low risk patient -     Hepatitis C antibody  Post-surgical hypothyroidism Assessment & Plan: Clinically stable on synthroid. Obtain follow up tsh.  Orders: -     US THYROID; Future -     TSH  Multiple thyroid nodules Assessment & Plan: Will update thyroid US and plan referral to Dr. [facility] for hopefully a sooner appointment.  Orders: -     Ambulatory referral to Endocrinology  Viral URI with cough -     Fluticasone Propionate; Place 2 sprays into both nostrils daily.  Dispense: 16 g; Refill: 6  Osteoporosis, unspecified osteoporosis type, unspecified pathological fracture presence -     DG Bone Density; Future  Other orders -     Montelukast Sodium; Take 1 tablet (10 mg total) by mouth at bedtime.  Dispense: 90 tablet; Refill: 1 -     Levothyroxine Sodium; Take 1 tablet (25 mcg total) by mouth daily.  Dispense: 90 tablet; Refill: 1 -     Albuterol Sulfate HFA; Inhale 2 puffs into the lungs every 6 (six) hours as needed.  Dispense: 18 g; Refill: 3  Of note- no cold symptoms today.  Viral URI code was historically associated with flonase rx.   I, Lemont Fillers, NP, personally preformed the services described in this documentation.  All medical record entries made by the scribe were at my direction and in my presence.   I have reviewed the chart and discharge instructions (if applicable) and agree that the record reflects my personal performance and is accurate and complete. 01/01/2023   I,Shehryar Baig,acting as a Neurosurgeon for Lemont Fillers, NP.,have documented all relevant documentation on the behalf of Lemont Fillers, NP,as directed by  Lemont Fillers, NP while in the presence of Lemont Fillers, NP.   Lemont Fillers, NP

## 2023-01-01 NOTE — Telephone Encounter (Signed)
Please advise the patient that the radiologist is recommending an x-ray of her left hip to evaluate a spot that was seen on her bone density.  I have placed orders.

## 2023-01-02 ENCOUNTER — Ambulatory Visit (HOSPITAL_BASED_OUTPATIENT_CLINIC_OR_DEPARTMENT_OTHER)
Admission: RE | Admit: 2023-01-02 | Discharge: 2023-01-02 | Disposition: A | Discharge status: Home / Self Care | Payer: Federal, State, Local not specified - PPO | Source: Ambulatory Visit | Attending: Family | Admitting: Family

## 2023-01-02 ENCOUNTER — Telehealth: Payer: Self-pay | Admitting: Family

## 2023-01-02 DIAGNOSIS — M899 Disorder of bone, unspecified: Secondary | ICD-10-CM | POA: Diagnosis not present

## 2023-01-02 DIAGNOSIS — E559 Vitamin D deficiency, unspecified: Secondary | ICD-10-CM

## 2023-01-02 LAB — HEPATITIS C ANTIBODY: Hepatitis C Ab: NONREACTIVE

## 2023-01-02 MED ORDER — VITAMIN D (ERGOCALCIFEROL) 1.25 MG (50000 UNIT) PO CAPS: 50000.0000 [IU] | ORAL_CAPSULE | ORAL | 0 refills | Status: DC

## 2023-01-02 MED ORDER — ALENDRONATE SODIUM 70 MG PO TABS: 70.0000 mg | ORAL_TABLET | ORAL | 4 refills | Status: DC

## 2023-01-02 MED ORDER — CALTRATE 600+D PLUS MINERALS 600-800 MG-UNIT PO CHEW: CHEWABLE_TABLET | ORAL | 0 refills | Status: AC

## 2023-01-02 NOTE — Telephone Encounter (Signed)
See mychart.  

## 2023-01-02 NOTE — Telephone Encounter (Signed)
Please advise pt that I sent fosamax rx.   Also, vit D is still a little low. I would like to add vit D 50000 iu once weekly and plan to recheck vit D in 12 weeks.    Hep C is negative.   Thyroid blood test is normal.  Thyroid ultrasound also looks good. It notes only some very tiny cysts in the left thyroid lobe that are not concerning.   Cholesterol remains mildly elevated. Please continue to work on healthy diet, regular exercise.

## 2023-01-02 NOTE — Telephone Encounter (Signed)
Called but no answer, lvm for patient to call back about results °

## 2023-01-02 NOTE — Addendum Note (Signed)
Addended by: Debbrah Alar on: 01/02/2023 12:59 PM   Modules accepted: Orders

## 2023-01-17 ENCOUNTER — Telehealth: Payer: Self-pay | Admitting: Family

## 2023-01-17 NOTE — Telephone Encounter (Signed)
Pt stated she got a call from our office and wanted to know what it was regarding. Please advise.

## 2023-01-18 NOTE — Telephone Encounter (Signed)
Patient advised there has not been any new messages since her last conversation with Melissa on 01/01/23.  She reports she has not hear from endo a [facility], phone number provided for her to call them about the new referral.

## 2023-05-06 ENCOUNTER — Encounter: Payer: Self-pay | Admitting: Family Medicine

## 2023-05-06 ENCOUNTER — Ambulatory Visit: Payer: Federal, State, Local not specified - PPO | Admitting: Family Medicine

## 2023-05-06 ENCOUNTER — Ambulatory Visit: Payer: Federal, State, Local not specified - PPO | Admitting: Medical

## 2023-05-06 VITALS — BP 117/69 | HR 60 | Temp 97.3°F | Ht 63.0 in | Wt 113.0 lb

## 2023-05-06 DIAGNOSIS — R051 Acute cough: Secondary | ICD-10-CM | POA: Diagnosis not present

## 2023-05-06 DIAGNOSIS — J45909 Unspecified asthma, uncomplicated: Secondary | ICD-10-CM

## 2023-05-06 DIAGNOSIS — R52 Pain, unspecified: Secondary | ICD-10-CM

## 2023-05-06 DIAGNOSIS — R110 Nausea: Secondary | ICD-10-CM

## 2023-05-06 DIAGNOSIS — J029 Acute pharyngitis, unspecified: Secondary | ICD-10-CM | POA: Diagnosis not present

## 2023-05-06 DIAGNOSIS — J069 Acute upper respiratory infection, unspecified: Secondary | ICD-10-CM

## 2023-05-06 LAB — POCT RAPID STREP A (OFFICE): Rapid Strep A Screen: NEGATIVE

## 2023-05-06 MED ORDER — ALBUTEROL SULFATE HFA 108 (90 BASE) MCG/ACT IN AERS: 2.0000 | INHALATION_SPRAY | Freq: Four times a day (QID) | RESPIRATORY_TRACT | 3 refills | Status: DC | PRN

## 2023-05-06 MED ORDER — FLUTICASONE PROPIONATE HFA 44 MCG/ACT IN AERO: 2.0000 | INHALATION_SPRAY | Freq: Two times a day (BID) | RESPIRATORY_TRACT | 5 refills | Status: DC

## 2023-05-06 MED ORDER — ONDANSETRON HCL 4 MG PO TABS: 4.0000 mg | ORAL_TABLET | Freq: Three times a day (TID) | ORAL | 0 refills | Status: DC | PRN

## 2023-05-06 MED ORDER — FLUTICASONE PROPIONATE 50 MCG/ACT NA SUSP: 2.0000 | Freq: Every day | NASAL | 6 refills | Status: DC

## 2023-05-06 NOTE — Patient Instructions (Signed)
Strep testing is negative Likely another viral infection. Recommend continuing with supportive measures and over-the-counter medicines like you are doing currently. I will send in some Zofran for the nausea.  Continue supportive measures including rest, hydration, humidifier use, steam showers, warm compresses to sinuses, warm liquids with lemon and honey, and over-the-counter cough, cold, and analgesics as needed.  Please contact office for follow-up if symptoms do not improve or worsen. Seek emergency care if symptoms become severe.   The following information is provided as a Counsellor for ADULT patients only and does NOT take into account PREGNANCY, ALLERGIES, LIVER CONDITIONS, KIDNEY CONDITIONS, GASTROINTESTINAL CONDITIONS, OR PRESCRIPTION MEDICATION INTERACTIONS. Please be sure to ask your provider if the following are safe to take with your specific medical history, conditions, or current medication regimen if you are unsure.   Adult Basic Symptom Management  Congestion: Guaifenesin (Mucinex)- follow directions on packaging with a maximum dose of 2400mg  in a 24 hour period.  Pain/Fever: Ibuprofen 200mg  - 400mg  every 4-6 hours as needed (MAX 1200mg  in a 24 hour period) Pain/Fever: Tylenol 500mg  -1000mg  every 6-8 hours as needed (MAX 3000mg  in a 24 hour period)  Cough: Dextromethorphan (Delsym)- follow directions on packing with a maximum dose of 120mg  in a 24 hour period.  Nasal Stuffiness: Saline nasal spray and/or Nettie Pot with sterile saline solution  Runny Nose: Fluticasone nasal spray (Flonase) OR Mometasone nasal spray (Nasonex) OR Triamcinolone Acetonide nasal spray (Nasacort)- follow directions on the packaging  Pain/Pressure: Warm washcloth to the face  Sore Throat: Warm salt water gargles  If you have allergies, you may also consider taking an oral antihistamine (like Zyrtec or Claritin) as these may also help with your symptoms.  **Many medications will have more  than one ingredient, be sure you are reading the packaging carefully and not taking more than one dose of the same kind of medication at the same time or too close together. It is OK to use formulas that have all of the ingredients you want, but do not take them in a combined medication and as separate dose too close together. If you have any questions, the pharmacist will be happy to help you decide what is safe.

## 2023-05-06 NOTE — Progress Notes (Signed)
Acute Office Visit  Subjective:     Patient ID: Shalita Waymon, female    DOB: November 09, 1951, 72 y.o.   MRN: [id number]  Chief Complaint  Patient presents with   Sore Throat     Patient is in today for URI symptoms.   Upper Respiratory Infection: Patient complains of symptoms of a URI. Symptoms include  sore throat, postnasal drainage, rhinorrhea, axillary sinus pressure, occasional sneezing, rare cough, headache, nausea, body aches, fatigue . Onset of symptoms was 3 days ago, gradually worsening since that time.   She is drinking plenty of fluids. Evaluation to date: none. Treatment to date:  Zyrtec, Tylenol, ibuprofen, Flonase, Astelin, Singulair . No known sick contacts. No fevers, chills, vomiting, diarrhea, ear pain, rashes, chest pain, dyspnea, wheezing.      ROS All review of systems negative except what is listed in the HPI      Objective:    BP 117/69   Pulse 60   Temp (!) 97.3 F (36.3 C) (Oral)   Ht 5\' 3"  (1.6 m)   Wt 113 lb (51.3 kg)   SpO2 99%   BMI 20.02 kg/m    Physical Exam Vitals reviewed.  Constitutional:      General: She is not in acute distress.    Appearance: Normal appearance. She is not ill-appearing.  HENT:     Head: Normocephalic and atraumatic.     Right Ear: Tympanic membrane normal.     Left Ear: Tympanic membrane normal.     Nose: Rhinorrhea present. No congestion.     Mouth/Throat:     Mouth: Mucous membranes are moist.     Pharynx: Oropharynx is clear. No oropharyngeal exudate or posterior oropharyngeal erythema.  Eyes:     Extraocular Movements: Extraocular movements intact.     Conjunctiva/sclera: Conjunctivae normal.     Pupils: Pupils are equal, round, and reactive to light.  Cardiovascular:     Rate and Rhythm: Normal rate and regular rhythm.     Pulses: Normal pulses.     Heart sounds: Normal heart sounds.  Pulmonary:     Effort: Pulmonary effort is normal.     Breath sounds: Normal breath sounds.  Musculoskeletal:      Cervical back: Normal range of motion and neck supple. No rigidity or tenderness.  Lymphadenopathy:     Cervical: No cervical adenopathy.  Skin:    General: Skin is warm and dry.  Neurological:     Mental Status: She is alert and oriented to person, place, and time.  Psychiatric:        Mood and Affect: Mood normal.        Behavior: Behavior normal.        Thought Content: Thought content normal.        Judgment: Judgment normal.     Results for orders placed or performed in visit on 05/06/23  POCT rapid strep A  Result Value Ref Range   Rapid Strep A Screen Negative Negative        Assessment & Plan:   Problem List Items Addressed This Visit     Asthma   Relevant Medications   albuterol (VENTOLIN HFA) 108 (90 Base) MCG/ACT inhaler   fluticasone (FLOVENT HFA) 44 MCG/ACT inhaler   Other Visit Diagnoses     Viral URI    -  Primary   Relevant Medications   fluticasone (FLONASE) 50 MCG/ACT nasal spray   Sore throat       Relevant Orders  POCT rapid strep A (Completed)   Body aches       Acute cough       Nausea       Relevant Medications   ondansetron (ZOFRAN) 4 MG tablet      Strep testing is negative Likely another viral infection. Recommend continuing with supportive measures and over-the-counter medicines like you are doing currently. I will send in some Zofran for the nausea.  Continue supportive measures including rest, hydration, humidifier use, steam showers, warm compresses to sinuses, warm liquids with lemon and honey, and over-the-counter cough, cold, and analgesics as needed.  Please contact office for follow-up if symptoms do not improve or worsen. Seek emergency care if symptoms become severe.      Meds ordered this encounter  Medications   ondansetron (ZOFRAN) 4 MG tablet    Sig: Take 1 tablet (4 mg total) by mouth every 8 (eight) hours as needed for nausea or vomiting.    Dispense:  20 tablet    Refill:  0    Order Specific Question:    Supervising Provider    Answer:   Danise Edge A [[id number]]   albuterol (VENTOLIN HFA) 108 (90 Base) MCG/ACT inhaler    Sig: Inhale 2 puffs into the lungs every 6 (six) hours as needed.    Dispense:  18 g    Refill:  3    Please dispense with spacer.    Order Specific Question:   Supervising Provider    Answer:   Danise Edge A [[id number]]   fluticasone (FLONASE) 50 MCG/ACT nasal spray    Sig: Place 2 sprays into both nostrils daily.    Dispense:  16 g    Refill:  6    Order Specific Question:   Supervising Provider    Answer:   Danise Edge A [[id number]]   fluticasone (FLOVENT HFA) 44 MCG/ACT inhaler    Sig: Inhale 2 puffs into the lungs 2 (two) times daily.    Dispense:  1 each    Refill:  5    Order Specific Question:   Supervising Provider    Answer:   Danise Edge A [[id number]]    Return if symptoms worsen or fail to improve.  Clayborne Dana, NP

## 2023-05-14 ENCOUNTER — Telehealth: Payer: Self-pay | Admitting: Family

## 2023-05-14 NOTE — Telephone Encounter (Signed)
Can you please ask pt where her cologuard was performed so we can request a copy of the report.  If it is more than 70 years old I will send her a new kit to update.

## 2023-05-17 NOTE — Telephone Encounter (Signed)
Spoke to patient and she reports she had this done in [place]. She will call back with the provider's information

## 2023-05-22 NOTE — Telephone Encounter (Signed)
[facility], patient had a negative cologuard on 07/10/2021 and is due for next one July 2025.  Test results will be fax to [place] for record keeping.

## 2023-07-30 ENCOUNTER — Encounter: Payer: Federal, State, Local not specified - PPO | Admitting: Family

## 2023-08-01 ENCOUNTER — Ambulatory Visit: Payer: Federal, State, Local not specified - PPO | Admitting: Physician Assistant

## 2023-08-01 ENCOUNTER — Encounter: Payer: Self-pay | Admitting: Physician Assistant

## 2023-08-01 VITALS — BP 136/76 | HR 81 | Temp 98.1°F | Resp 20 | Wt 110.0 lb

## 2023-08-01 DIAGNOSIS — J029 Acute pharyngitis, unspecified: Secondary | ICD-10-CM

## 2023-08-01 DIAGNOSIS — J069 Acute upper respiratory infection, unspecified: Secondary | ICD-10-CM | POA: Diagnosis not present

## 2023-08-01 DIAGNOSIS — J45909 Unspecified asthma, uncomplicated: Secondary | ICD-10-CM

## 2023-08-01 DIAGNOSIS — R051 Acute cough: Secondary | ICD-10-CM

## 2023-08-01 LAB — POC COVID19 BINAXNOW: SARS Coronavirus 2 Ag: NEGATIVE

## 2023-08-01 LAB — POCT RAPID STREP A (OFFICE): Rapid Strep A Screen: NEGATIVE

## 2023-08-01 MED ORDER — FLUTICASONE PROPIONATE HFA 44 MCG/ACT IN AERO: 2.0000 | INHALATION_SPRAY | Freq: Two times a day (BID) | RESPIRATORY_TRACT | 5 refills | Status: DC

## 2023-08-01 MED ORDER — AZELASTINE HCL 137 MCG/SPRAY NA SOLN: 2.0000 | Freq: Two times a day (BID) | NASAL | 5 refills | Status: DC

## 2023-08-01 MED ORDER — FLUTICASONE PROPIONATE 50 MCG/ACT NA SUSP: 2.0000 | Freq: Every day | NASAL | 6 refills | Status: DC

## 2023-08-01 MED ORDER — ALBUTEROL SULFATE HFA 108 (90 BASE) MCG/ACT IN AERS: 2.0000 | INHALATION_SPRAY | Freq: Four times a day (QID) | RESPIRATORY_TRACT | 3 refills | Status: DC | PRN

## 2023-08-01 NOTE — Progress Notes (Signed)
Established patient visit   Patient: Gabrielle Day   DOB: 1951-02-15   72 y.o. Female  MRN: [id number] Visit Date: 08/01/2023  Today's healthcare provider: Alfredia Ferguson, PA-C   Cc. Cough, headache  Subjective     Pt presents today with a cough for the last few days, sore throat, nasal congestion, headache. Reports negative COVID test at home yesterday. Reports recent travel back from [place] yesterday. This illness started in [place]. Denies fever, rashes, n/v.  She has a history of asthma, has been appreciating some shortness of breath improved with her albuterol inhaler.  Medications: Outpatient Medications Prior to Visit  Medication Sig   alendronate (FOSAMAX) 70 MG tablet Take 1 tablet (70 mg total) by mouth every 7 (seven) days. Take with a full glass of water on an empty stomach and sit upright for 90 minutes after taking   Calcium Carbonate-Vit D-Min (CALTRATE 600+D PLUS MINERALS) 600-800 MG-UNIT CHEW One chew twice daily.   Cholecalciferol (VITAMIN D3) 75 MCG (3000 UT) TABS Take 1 tablet by mouth daily at 6 (six) AM.   levocetirizine (XYZAL) 5 MG tablet Take 5 mg by mouth every evening.   levothyroxine (SYNTHROID) 25 MCG tablet Take 1 tablet (25 mcg total) by mouth daily.   montelukast (SINGULAIR) 10 MG tablet Take 1 tablet (10 mg total) by mouth at bedtime.   ondansetron (ZOFRAN) 4 MG tablet Take 1 tablet (4 mg total) by mouth every 8 (eight) hours as needed for nausea or vomiting.   Vitamin D, Ergocalciferol, (DRISDOL) 1.25 MG (50000 UNIT) CAPS capsule Take 1 capsule (50,000 Units total) by mouth every 7 (seven) days.   [DISCONTINUED] albuterol (VENTOLIN HFA) 108 (90 Base) MCG/ACT inhaler Inhale 2 puffs into the lungs every 6 (six) hours as needed.   [DISCONTINUED] Azelastine HCl 137 MCG/SPRAY SOLN Place 2 sprays into the nose in the morning and at bedtime.   [DISCONTINUED] fluticasone (FLONASE) 50 MCG/ACT nasal spray Place 2 sprays into both nostrils daily.    [DISCONTINUED] fluticasone (FLOVENT HFA) 44 MCG/ACT inhaler Inhale 2 puffs into the lungs 2 (two) times daily.   No facility-administered medications prior to visit.    Review of Systems  Constitutional:  Positive for fatigue. Negative for fever.  HENT:  Positive for congestion, sinus pressure and sinus pain.   Respiratory:  Positive for cough. Negative for shortness of breath.   Cardiovascular:  Negative for chest pain and leg swelling.  Gastrointestinal:  Negative for abdominal pain.  Neurological:  Positive for headaches. Negative for dizziness.      Objective    BP 136/76 (BP Location: Left Arm, Patient Position: Sitting, Cuff Size: Small)   Pulse 81   Temp 98.1 F (36.7 C) (Oral)   Resp 20   Wt 110 lb (49.9 kg)   SpO2 98%   BMI 19.49 kg/m   Physical Exam Constitutional:      General: She is awake.     Appearance: She is well-developed.  HENT:     Head: Normocephalic.     Mouth/Throat:     Pharynx: Posterior oropharyngeal erythema present. No oropharyngeal exudate.  Eyes:     Conjunctiva/sclera: Conjunctivae normal.  Cardiovascular:     Rate and Rhythm: Normal rate and regular rhythm.     Heart sounds: Normal heart sounds.  Pulmonary:     Effort: Pulmonary effort is normal.     Breath sounds: Normal breath sounds.  Skin:    General: Skin is warm.  Neurological:  Mental Status: She is alert and oriented to person, place, and time.  Psychiatric:        Attention and Perception: Attention normal.        Mood and Affect: Mood normal.        Speech: Speech normal.        Behavior: Behavior is cooperative.      No results found for any visits on 08/01/23.  Assessment & Plan     1. Acute cough Poc covid negative. - POC COVID-19 2. Viral URI 3. Uncomplicated asthma, unspecified asthma severity, unspecified whether persistent Advised pt use inhalers prn , nasal sprays, tylenol/advil. Encouraged hydration, rest.  - fluticasone (FLONASE) 50 MCG/ACT nasal  spray; Place 2 sprays into both nostrils daily.  Dispense: 16 g; Refill: 6 - fluticasone (FLOVENT HFA) 44 MCG/ACT inhaler; Inhale 2 puffs into the lungs 2 (two) times daily.  Dispense: 1 each; Refill: 5 - albuterol (VENTOLIN HFA) 108 (90 Base) MCG/ACT inhaler; Inhale 2 puffs into the lungs every 6 (six) hours as needed.  Dispense: 18 g; Refill: 3  4. Sore throat Poc strep negative. - POCT rapid strep A   Return if symptoms worsen or fail to improve.      I, Alfredia Ferguson, PA-C have reviewed all documentation for this visit. The documentation on  08/01/23   for the exam, diagnosis, procedures, and orders are all accurate and complete.    Alfredia Ferguson, PA-C  [facility] [phone] (phone) [phone] (fax)  [facility]

## 2023-08-01 NOTE — Patient Instructions (Addendum)
You can use albuterol as needed for shortness of breath/chest tightness, 1-2 puffs every 4-6 hours.  You can start back on the flovent , 2 puffs 1-2 times a day .  Ibuprofen 600 mg every 6 hours, alternating with tylenol 1000 mg every 8 hours for headache.   Use your flonase and azelastine nasal sprays.

## 2023-08-05 ENCOUNTER — Ambulatory Visit: Payer: Federal, State, Local not specified - PPO | Admitting: Physician Assistant

## 2023-08-05 ENCOUNTER — Encounter: Payer: Self-pay | Admitting: Physician Assistant

## 2023-08-05 VITALS — BP 104/62 | HR 76 | Temp 98.0°F | Resp 20 | Wt 109.0 lb

## 2023-08-05 DIAGNOSIS — J4541 Moderate persistent asthma with (acute) exacerbation: Secondary | ICD-10-CM

## 2023-08-05 MED ORDER — ALBUTEROL SULFATE (2.5 MG/3ML) 0.083% IN NEBU: 2.5000 mg | INHALATION_SOLUTION | Freq: Four times a day (QID) | RESPIRATORY_TRACT | 1 refills | Status: DC | PRN

## 2023-08-05 MED ORDER — PREDNISONE 10 MG PO TABS: ORAL_TABLET | ORAL | 0 refills | Status: AC

## 2023-08-05 MED ORDER — NEBULIZER MISC: 0 refills | Status: AC

## 2023-08-05 MED ORDER — DOXYCYCLINE HYCLATE 100 MG PO TABS: 100.0000 mg | ORAL_TABLET | Freq: Two times a day (BID) | ORAL | 0 refills | Status: AC

## 2023-08-05 MED ORDER — NEBULIZER MASK ADULT MISC: 0 refills | Status: AC

## 2023-08-05 NOTE — Progress Notes (Signed)
Established patient visit   Patient: Gabrielle Day   DOB: [birth year]   72 y.o. Female  MRN: [id number] Visit Date: 08/05/2023  Today's healthcare provider: Alfredia Ferguson, PA-C   Chief Complaint  Patient presents with   Cough    Follow up cough 08/01/2023 states symptom are getting worst. Cough with yellowish phlegm and fever a couple days ago.    Subjective    HPI Pt was seen in office 08/01/23 for cough and other URI symptoms. Negative poc strep, covid. Recent travel. She was advised symptomatic treatment and to continue w/ her asthma treatment.   Pt reports pt symptoms are getting worse, wheezing with albuterol inhaler use q 4 hours. Constant cough, productive with yellow mucous. Reports low grade fevers at home.  Medications: Outpatient Medications Prior to Visit  Medication Sig   albuterol (VENTOLIN HFA) 108 (90 Base) MCG/ACT inhaler Inhale 2 puffs into the lungs every 6 (six) hours as needed.   alendronate (FOSAMAX) 70 MG tablet Take 1 tablet (70 mg total) by mouth every 7 (seven) days. Take with a full glass of water on an empty stomach and sit upright for 90 minutes after taking   Azelastine HCl 137 MCG/SPRAY SOLN Place 2 sprays into the nose in the morning and at bedtime.   Calcium Carbonate-Vit D-Min (CALTRATE 600+D PLUS MINERALS) 600-800 MG-UNIT CHEW One chew twice daily.   Cholecalciferol (VITAMIN D3) 75 MCG (3000 UT) TABS Take 1 tablet by mouth daily at 6 (six) AM.   fluticasone (FLONASE) 50 MCG/ACT nasal spray Place 2 sprays into both nostrils daily.   fluticasone (FLOVENT HFA) 44 MCG/ACT inhaler Inhale 2 puffs into the lungs 2 (two) times daily.   levocetirizine (XYZAL) 5 MG tablet Take 5 mg by mouth every evening.   levothyroxine (SYNTHROID) 25 MCG tablet Take 1 tablet (25 mcg total) by mouth daily.   montelukast (SINGULAIR) 10 MG tablet Take 1 tablet (10 mg total) by mouth at bedtime.   ondansetron (ZOFRAN) 4 MG tablet Take 1 tablet (4 mg total) by mouth every  8 (eight) hours as needed for nausea or vomiting.   Vitamin D, Ergocalciferol, (DRISDOL) 1.25 MG (50000 UNIT) CAPS capsule Take 1 capsule (50,000 Units total) by mouth every 7 (seven) days.   No facility-administered medications prior to visit.    Review of Systems  Constitutional:  Positive for fatigue. Negative for fever.  Respiratory:  Positive for cough and wheezing. Negative for shortness of breath.   Cardiovascular:  Negative for chest pain and leg swelling.  Gastrointestinal:  Negative for abdominal pain.  Neurological:  Negative for dizziness and headaches.      Objective    BP 104/62 (BP Location: Left Arm, Patient Position: Sitting, Cuff Size: Small)   Pulse 76   Temp 98 F (36.7 C) (Oral)   Resp 20   Wt 109 lb (49.4 kg)   SpO2 99%   BMI 19.31 kg/m   Physical Exam Constitutional:      General: She is awake.     Appearance: She is well-developed.  HENT:     Head: Normocephalic.  Eyes:     Conjunctiva/sclera: Conjunctivae normal.  Cardiovascular:     Rate and Rhythm: Normal rate and regular rhythm.     Heart sounds: Normal heart sounds.  Pulmonary:     Effort: Pulmonary effort is normal.     Breath sounds: Normal breath sounds. No wheezing.  Skin:    General: Skin is warm.  Neurological:  Mental Status: She is alert and oriented to person, place, and time.  Psychiatric:        Attention and Perception: Attention normal.        Mood and Affect: Mood normal.        Speech: Speech normal.        Behavior: Behavior is cooperative.      No results found for any visits on 08/05/23.  Assessment & Plan     1. Moderate persistent asthma with exacerbation Lungs CTA today w/o wheezing, but pt reports around the clock albuterol use Rx nebulized albuterol w/ machine Rx prednisone taper   Pt reports some low grade fevers, will rx doxycycline bid x 7 days .  - doxycycline (VIBRA-TABS) 100 MG tablet; Take 1 tablet (100 mg total) by mouth 2 (two) times daily  for 7 days.  Dispense: 14 tablet; Refill: 0 - predniSONE (DELTASONE) 10 MG tablet; Take 6 tablets (60 mg total) by mouth daily with breakfast for 1 day, THEN 5 tablets (50 mg total) daily with breakfast for 1 day, THEN 4 tablets (40 mg total) daily with breakfast for 1 day, THEN 3 tablets (30 mg total) daily with breakfast for 1 day, THEN 2 tablets (20 mg total) daily with breakfast for 1 day, THEN 1 tablet (10 mg total) daily with breakfast for 1 day.  Dispense: 21 tablet; Refill: 0 - albuterol (PROVENTIL) (2.5 MG/3ML) 0.083% nebulizer solution; Take 3 mLs (2.5 mg total) by nebulization every 6 (six) hours as needed for wheezing or shortness of breath.  Dispense: 75 mL; Refill: 1 - Nebulizer MISC; Use with nebulized medication q 6 hours prn  Dispense: 1 each; Refill: 0 - Respiratory Therapy Supplies (NEBULIZER MASK ADULT) MISC; Use with nebulized medication q 6 hours prn  Dispense: 1 each; Refill: 0   Return if symptoms worsen or fail to improve.      I, Alfredia Ferguson, PA-C have reviewed all documentation for this visit. The documentation on  08/05/23   for the exam, diagnosis, procedures, and orders are all accurate and complete.   Alfredia Ferguson, PA-C  [facility] [phone] (phone) [phone] (fax)  [facility]

## 2023-08-07 ENCOUNTER — Other Ambulatory Visit: Payer: Self-pay | Admitting: Physician Assistant

## 2023-08-07 ENCOUNTER — Encounter: Payer: Self-pay | Admitting: Physician Assistant

## 2023-08-07 DIAGNOSIS — R051 Acute cough: Secondary | ICD-10-CM

## 2023-08-07 DIAGNOSIS — J4541 Moderate persistent asthma with (acute) exacerbation: Secondary | ICD-10-CM

## 2023-08-08 ENCOUNTER — Ambulatory Visit (HOSPITAL_BASED_OUTPATIENT_CLINIC_OR_DEPARTMENT_OTHER)
Admission: RE | Admit: 2023-08-08 | Discharge: 2023-08-08 | Disposition: A | Discharge status: Home / Self Care | Payer: Federal, State, Local not specified - PPO | Source: Ambulatory Visit | Attending: Physician Assistant | Admitting: Physician Assistant

## 2023-08-08 ENCOUNTER — Encounter (INDEPENDENT_AMBULATORY_CARE_PROVIDER_SITE_OTHER): Payer: Self-pay

## 2023-08-08 ENCOUNTER — Encounter: Payer: Self-pay | Admitting: Physician Assistant

## 2023-08-08 DIAGNOSIS — R051 Acute cough: Secondary | ICD-10-CM

## 2023-08-08 DIAGNOSIS — J189 Pneumonia, unspecified organism: Secondary | ICD-10-CM

## 2023-08-13 ENCOUNTER — Telehealth: Payer: Self-pay | Admitting: Family

## 2023-08-13 NOTE — Telephone Encounter (Signed)
Gabrielle Day (spouse DPR Ok) called stating that pt is not able to get in with [facility] until October and needs to have her referral rerouted to the following office:   [facility] at [facility] [address] [place], [place] [address] Phone: [phone] Fax: [phone] Fax: [phone]

## 2023-08-13 NOTE — Telephone Encounter (Signed)
Pt's husband called to advise that she had an xray on Wednesday and they still have not received any results yet. Pt had to be taken to urgent care over the weekend because she was no better and they found pneumonia. Pt's husband said had he waited she could far worse. Please follow up on delay of results.

## 2023-08-14 ENCOUNTER — Telehealth: Payer: Self-pay | Admitting: Family

## 2023-08-14 ENCOUNTER — Encounter: Payer: Self-pay | Admitting: Family

## 2023-08-14 ENCOUNTER — Ambulatory Visit: Payer: Federal, State, Local not specified - PPO | Admitting: Family

## 2023-08-14 VITALS — BP 110/61 | HR 64 | Temp 97.6°F | Resp 16 | Wt 108.0 lb

## 2023-08-14 DIAGNOSIS — E890 Postprocedural hypothyroidism: Secondary | ICD-10-CM | POA: Diagnosis not present

## 2023-08-14 DIAGNOSIS — J4540 Moderate persistent asthma, uncomplicated: Secondary | ICD-10-CM

## 2023-08-14 DIAGNOSIS — E042 Nontoxic multinodular goiter: Secondary | ICD-10-CM

## 2023-08-14 DIAGNOSIS — E559 Vitamin D deficiency, unspecified: Secondary | ICD-10-CM

## 2023-08-14 DIAGNOSIS — E785 Hyperlipidemia, unspecified: Secondary | ICD-10-CM | POA: Diagnosis not present

## 2023-08-14 DIAGNOSIS — J189 Pneumonia, unspecified organism: Secondary | ICD-10-CM

## 2023-08-14 DIAGNOSIS — M810 Age-related osteoporosis without current pathological fracture: Secondary | ICD-10-CM

## 2023-08-14 MED ORDER — BUDESONIDE-FORMOTEROL FUMARATE 80-4.5 MCG/ACT IN AERO: 2.0000 | INHALATION_SPRAY | Freq: Two times a day (BID) | RESPIRATORY_TRACT | 3 refills | Status: DC

## 2023-08-14 NOTE — Telephone Encounter (Signed)
[employer] [facility] unable to release report due to [place] not being the ordering provider.  Patient reports she had this while living in [place].

## 2023-08-14 NOTE — Patient Instructions (Signed)
VISIT SUMMARY:  During your recent visit, we discussed your ongoing respiratory symptoms, thyroid issues, low vitamin D levels, and osteoporosis. We believe your respiratory symptoms may be due to an exacerbation of your asthma, possibly triggered by allergies. Your thyroid condition appears stable, and we will continue to monitor your vitamin D levels. We also discussed starting treatment for osteoporosis (fosamax) once you're feeling better.  YOUR PLAN:  -RESPIRATORY INFECTION/ASTHMA: Your recent respiratory infection was treated with antibiotics and prednisone. Your current symptoms suggest a possible worsening of your asthma, possibly due to allergies. We will change your asthma medication to Symbicort (stop Flovent) and you should continue to use Ventolin as needed. We also recommend considering a daily antihistamine to help with postnasal drainage and Mucinex to help with phlegm production.  -HYPOTHYROIDISM: Your thyroid condition appears stable on your current dose of Levothyroxine. You should continue this regimen.  -VITAMIN D DEFICIENCY: You have completed a 12-week course of Vitamin D supplementation. We will check your Vitamin D level and recommend resuming daily Vitamin D supplementation once you're feeling better.  -OSTEOPOROSIS: We have prescribed Fosamax for your osteoporosis but you have not yet started. We recommend starting this medication once a week when you're feeling better.  INSTRUCTIONS:  Please continue taking Cefpodoxime until completion. Start taking Symbicort twice daily and discontinue Flovent. Use Ventolin as needed. Consider taking a daily antihistamine to help with postnasal drainage and Mucinex to help with phlegm production. Continue your current regimen of Levothyroxine for your thyroid condition. We will check your Vitamin D level today and recommend resuming daily Vitamin D supplementation 2000 units daily once you're feeling better. Start taking Fosamax once a  week when you're feeling better. Please keep your appointment with your endocrinologist in September. Get a flu shot in the fall and consider a COVID-19 booster shot when available.

## 2023-08-14 NOTE — Progress Notes (Unsigned)
Subjective:     Patient ID: Gabrielle Day, female    DOB: 09-27-1951, 72 y.o.   MRN: [id number]  No chief complaint on file.   HPI  Discussed the use of AI scribe software for clinical note transcription with the patient, who gave verbal consent to proceed.  History of Present Illness         Patient is a 72 yr old female who presents today for follow up. She was seen on 08/01/23 by Arvella Nigh PA-C with acute cough that was felt to be due to viral URI. She returned on 08/05/23 due to worsening symptoms. She was treated for a an acute asthma exacerbation with a prednisone taper and a 7 day empiric course of doxycycline. Over this past weekend, her symptoms worsened and husband brought her to an urgent care where he reports that her chest x-ray revealed pneumonia.    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    Family History  Problem Relation Age of Onset   Alzheimer's disease Father    Stroke Brother    Asthma Son     Social History   Socioeconomic History   Marital status: Divorced    Spouse name: Visual merchandiser   Number of children: 2   Years of education: Not on file   Highest education level: Not on file  Occupational History   Not on file  Tobacco Use   Smoking status: Never   Smokeless tobacco: Never  Vaping Use   Vaping status: Never Used  Substance  and Sexual Activity   Alcohol use: Never   Drug use: Never   Sexual activity: Yes    Partners: Male  Other Topics Concern   Not on file  Social History Narrative   Married   2 grown children, 4 grandchildren (live in [place])   Homemaker   Enjoys reading   Grew up in [place], moved to [place] at age 40   Bachelor degree in history   Social Determinants of Corporate investment banker Strain: Not on file  Food Insecurity: Not on file  Transportation Needs: Not on file  Physical Activity: Not on file  Stress: Not on file  Social Connections: Not on file  Intimate Partner Violence: Not on file    Outpatient Medications Prior to Visit  Medication Sig Dispense Refill   albuterol (PROVENTIL) (2.5 MG/3ML) 0.083% nebulizer solution Take 3 mLs (2.5 mg total) by nebulization every 6 (six) hours as needed for wheezing or shortness of breath. 75 mL 1   albuterol (VENTOLIN HFA) 108 (90 Base) MCG/ACT inhaler Inhale 2 puffs into the lungs every 6 (six) hours as needed. 18 g 3   alendronate (FOSAMAX) 70 MG tablet Take 1 tablet (70 mg total) by mouth every  7 (seven) days. Take with a full glass of water on an empty stomach and sit upright for 90 minutes after taking 12 tablet 4   Azelastine HCl 137 MCG/SPRAY SOLN Place 2 sprays into the nose in the morning and at bedtime. 30 mL 5   Calcium Carbonate-Vit D-Min (CALTRATE 600+D PLUS MINERALS) 600-800 MG-UNIT CHEW One chew twice daily. 60 tablet 0   Cholecalciferol (VITAMIN D3) 75 MCG (3000 UT) TABS Take 1 tablet by mouth daily at 6 (six) AM. 30 tablet    fluticasone (FLONASE) 50 MCG/ACT nasal spray Place 2 sprays into both nostrils daily. 16 g 6   fluticasone (FLOVENT HFA) 44 MCG/ACT inhaler Inhale 2 puffs into the lungs 2 (two) times daily. 1 each 5   levocetirizine (XYZAL) 5 MG tablet Take 5 mg by mouth every evening.     levothyroxine (SYNTHROID) 25 MCG tablet Take 1 tablet (25 mcg total) by mouth daily. 90 tablet 1   montelukast (SINGULAIR) 10 MG tablet  Take 1 tablet (10 mg total) by mouth at bedtime. 90 tablet 1   Nebulizer MISC Use with nebulized medication q 6 hours prn 1 each 0   ondansetron (ZOFRAN) 4 MG tablet Take 1 tablet (4 mg total) by mouth every 8 (eight) hours as needed for nausea or vomiting. 20 tablet 0   Respiratory Therapy Supplies (NEBULIZER MASK ADULT) MISC Use with nebulized medication q 6 hours prn 1 each 0   Vitamin D, Ergocalciferol, (DRISDOL) 1.25 MG (50000 UNIT) CAPS capsule Take 1 capsule (50,000 Units total) by mouth every 7 (seven) days. 12 capsule 0   No facility-administered medications prior to visit.    Allergies  Allergen Reactions   Penicillins     ROS     Objective:    Physical Exam   There were no vitals taken for this visit. Wt Readings from Last 3 Encounters:  08/05/23 109 lb (49.4 kg)  08/01/23 110 lb (49.9 kg)  05/06/23 113 lb (51.3 kg)       Assessment & Plan:   Problem List Items Addressed This Visit   None   I am having Gabrielle Day "[name]" maintain her Vitamin D3, levocetirizine, montelukast, levothyroxine, Caltrate 600+D Plus Minerals, alendronate, Vitamin D (Ergocalciferol), ondansetron, Azelastine HCl, fluticasone, fluticasone, albuterol, albuterol, Nebulizer, and Nebulizer Mask Adult.  No orders of the defined types were placed in this encounter.

## 2023-08-14 NOTE — Telephone Encounter (Signed)
Patient is scheduled to see Melissa at 12:20 today

## 2023-08-14 NOTE — Telephone Encounter (Signed)
Pt never got results from cologuard that she sent in. Can you please check on results with the company?

## 2023-08-15 DIAGNOSIS — J189 Pneumonia, unspecified organism: Secondary | ICD-10-CM | POA: Insufficient documentation

## 2023-08-15 LAB — BASIC METABOLIC PANEL
BUN: 8 mg/dL (ref 6–23)
CO2: 29 mEq/L (ref 19–32)
Calcium: 9 mg/dL (ref 8.4–10.5)
Chloride: 100 mEq/L (ref 96–112)
Creatinine, Ser: 0.71 mg/dL (ref 0.40–1.20)
GFR: 86.34 mL/min (ref >60.00)
Glucose, Bld: 84 mg/dL (ref 70–99)
Potassium: 4.6 mEq/L (ref 3.5–5.1)
Sodium: 138 meq/L (ref 135–145)

## 2023-08-15 LAB — LIPID PANEL
Cholesterol: 206 mg/dL — ABNORMAL HIGH (ref 0–200)
HDL: 51.1 mg/dL (ref >39.00)
NonHDL: 154.6
Total CHOL/HDL Ratio: 4
Triglycerides: 228 mg/dL — ABNORMAL HIGH (ref 0.0–149.0)
VLDL: 45.6 mg/dL — ABNORMAL HIGH (ref 0.0–40.0)

## 2023-08-15 LAB — LDL CHOLESTEROL, DIRECT: Direct LDL: 137 mg/dL

## 2023-08-15 NOTE — Assessment & Plan Note (Signed)
  Stable on Levothyroxine daily. -Continue current regimen.

## 2023-08-15 NOTE — Assessment & Plan Note (Signed)
  Completed 12-week course of Vitamin D supplementation. -Check Vitamin D level today. -Resume daily Vitamin D supplementation once feeling better.

## 2023-08-15 NOTE — Assessment & Plan Note (Signed)
I do not have access to the outside CXR results. Our results were negative. Recommended the following:  Recent respiratory infection treated with antibiotics and prednisone. Current symptoms suggest possible asthma exacerbation with postnasal drainage possibly due to allergies. Lung exam reveals crackles at the base of the left lung. -Continue Cefpodoxime until completion. -Start Symbicort twice daily, discontinue Flovent. -Use Ventolin as needed. -Consider daily antihistamine (Claritin, Zyrtec, Allegra, or Xyzal) to help with postnasal drainage. -Consider Mucinex to help with phlegm production.

## 2023-08-15 NOTE — Assessment & Plan Note (Signed)
  Fosamax prescribed but not yet started. -Start Fosamax once a week when feeling better.

## 2023-08-15 NOTE — Assessment & Plan Note (Signed)
Start Symbicort twice daily, discontinue Flovent. -Use Ventolin as needed. -Consider daily antihistamine (Claritin, Zyrtec, Allegra, or Xyzal) to help with postnasal drainage.  Patient also plans to meet with pulmonology.

## 2023-08-15 NOTE — Assessment & Plan Note (Signed)
  1. Surgical changes of right hemithyroidectomy. 2. The remaining left thyroid gland is small and mildly heterogeneous. 3. No thyroid nodules identified that meet criteria to warrant further evaluation.

## 2023-08-16 LAB — TSH: TSH: 1.92 u[IU]/mL (ref 0.35–5.50)

## 2023-08-16 NOTE — Telephone Encounter (Signed)
Cologuard results printed for scanning and abstracted in chart

## 2023-08-18 LAB — VITAMIN D 1,25 DIHYDROXY
Vitamin D 1, 25 (OH)2 Total: 25 pg/mL (ref 18–72)
Vitamin D2 1, 25 (OH)2: <8 pg/mL
Vitamin D3 1, 25 (OH)2: 25 pg/mL

## 2023-08-19 ENCOUNTER — Encounter: Payer: Self-pay | Admitting: Family

## 2023-08-20 ENCOUNTER — Telehealth: Payer: Self-pay | Admitting: Family

## 2023-08-20 ENCOUNTER — Encounter: Payer: Federal, State, Local not specified - PPO | Admitting: Family

## 2023-08-20 NOTE — Telephone Encounter (Signed)
Records release form left upfront, patient confirmed she is coming by today to sign

## 2023-08-20 NOTE — Telephone Encounter (Signed)
Can you please leave a records release for pt to sign? I would like to request all thyroid pathology/cytology from [facility].

## 2023-08-20 NOTE — Telephone Encounter (Signed)
Release of information form left upfront, patient confirmed she will come by today to sign

## 2023-08-20 NOTE — Telephone Encounter (Signed)
-----   Message from Johnney Ou [facility] sent at 08/18/2023  7:09 PM EDT ----- Marcha Dutton,   I am sorry it took a long time to schedule this pt.   I agree with you, as long as she has no history of thyroid cancer ( I was unable to locate pathology report ) than having small nodules on the left does not require follow up .   Thank you for all your referrals    Abby ----- Message ----- From: Sandford Craze, NP Sent: 08/15/2023   1:23 PM EDT To: Orland Penman, MD  Dear Dr. Lonzo Cloud,  I referred this pt to you due to hx of thyroid nodules.  She is s/p R thyroidectomy.  The follow up US did not show any clinically significant nodules on the left and she is stable on synthroid.  I am thinking that it no longer makes sense for you to see you.  Please let me know your thoughts.  Corleen Otwell

## 2023-08-23 ENCOUNTER — Encounter: Payer: Self-pay | Admitting: Family

## 2023-08-28 ENCOUNTER — Encounter: Payer: Federal, State, Local not specified - PPO | Admitting: Family

## 2023-08-28 ENCOUNTER — Ambulatory Visit (INDEPENDENT_AMBULATORY_CARE_PROVIDER_SITE_OTHER): Payer: Federal, State, Local not specified - PPO | Admitting: Family

## 2023-08-28 ENCOUNTER — Encounter: Payer: Self-pay | Admitting: Family

## 2023-08-28 VITALS — BP 117/65 | HR 62 | Temp 98.6°F | Resp 16 | Ht 63.0 in | Wt 111.0 lb

## 2023-08-28 DIAGNOSIS — G43909 Migraine, unspecified, not intractable, without status migrainosus: Secondary | ICD-10-CM

## 2023-08-28 DIAGNOSIS — E785 Hyperlipidemia, unspecified: Secondary | ICD-10-CM

## 2023-08-28 DIAGNOSIS — Z23 Encounter for immunization: Secondary | ICD-10-CM

## 2023-08-28 DIAGNOSIS — Z0000 Encounter for general adult medical examination without abnormal findings: Secondary | ICD-10-CM

## 2023-08-28 DIAGNOSIS — E042 Nontoxic multinodular goiter: Secondary | ICD-10-CM

## 2023-08-28 DIAGNOSIS — F32A Depression, unspecified: Secondary | ICD-10-CM

## 2023-08-28 DIAGNOSIS — J189 Pneumonia, unspecified organism: Secondary | ICD-10-CM

## 2023-08-28 DIAGNOSIS — J45909 Unspecified asthma, uncomplicated: Secondary | ICD-10-CM

## 2023-08-28 DIAGNOSIS — Z8701 Personal history of pneumonia (recurrent): Secondary | ICD-10-CM

## 2023-08-28 DIAGNOSIS — E559 Vitamin D deficiency, unspecified: Secondary | ICD-10-CM | POA: Diagnosis not present

## 2023-08-28 DIAGNOSIS — Z862 Personal history of diseases of the blood and blood-forming organs and certain disorders involving the immune mechanism: Secondary | ICD-10-CM | POA: Diagnosis not present

## 2023-08-28 LAB — URINALYSIS, ROUTINE W REFLEX MICROSCOPIC
Bilirubin Urine: NEGATIVE
Hgb urine dipstick: NEGATIVE
Ketones, ur: NEGATIVE
Leukocytes,Ua: NEGATIVE
Nitrite: NEGATIVE
RBC / HPF: NONE SEEN (ref 0–?)
Specific Gravity, Urine: 1.01 (ref 1.000–1.030)
Total Protein, Urine: NEGATIVE
Urine Glucose: NEGATIVE
Urobilinogen, UA: 0.2 (ref 0.0–1.0)
WBC, UA: NONE SEEN (ref 0–?)
pH: 6.5 (ref 5.0–8.0)

## 2023-08-28 LAB — CBC WITH DIFFERENTIAL/PLATELET
Basophils Absolute: 0 10*3/uL (ref 0.0–0.1)
Basophils Relative: 0.6 % (ref 0.0–3.0)
Eosinophils Absolute: 0 10*3/uL (ref 0.0–0.7)
Eosinophils Relative: 1.2 % (ref 0.0–5.0)
HCT: 39 % (ref 36.0–46.0)
Hemoglobin: 12.3 g/dL (ref 12.0–15.0)
Lymphocytes Relative: 33.4 % (ref 12.0–46.0)
Lymphs Abs: 1.4 10*3/uL (ref 0.7–4.0)
MCHC: 31.5 g/dL (ref 30.0–36.0)
MCV: 89.8 fl (ref 78.0–100.0)
Monocytes Absolute: 0.4 10*3/uL (ref 0.1–1.0)
Monocytes Relative: 9.4 % (ref 3.0–12.0)
Neutro Abs: 2.3 10*3/uL (ref 1.4–7.7)
Neutrophils Relative %: 55.4 % (ref 43.0–77.0)
Platelets: 213 10*3/uL (ref 150.0–400.0)
RBC: 4.34 Mil/uL (ref 3.87–5.11)
RDW: 13.3 % (ref 11.5–15.5)
WBC: 4.1 10*3/uL (ref 4.0–10.5)

## 2023-08-28 LAB — LIPID PANEL
Cholesterol: 229 mg/dL — ABNORMAL HIGH (ref 0–200)
HDL: 75.2 mg/dL (ref >39.00)
LDL Cholesterol: 143 mg/dL — ABNORMAL HIGH (ref 0–99)
NonHDL: 154.15
Total CHOL/HDL Ratio: 3
Triglycerides: 58 mg/dL (ref 0.0–149.0)
VLDL: 11.6 mg/dL (ref 0.0–40.0)

## 2023-08-28 MED ORDER — LEVOTHYROXINE SODIUM 25 MCG PO TABS: 25.0000 ug | ORAL_TABLET | Freq: Every day | ORAL | 1 refills | Status: DC

## 2023-08-28 MED ORDER — ALBUTEROL SULFATE HFA 108 (90 BASE) MCG/ACT IN AERS: 2.0000 | INHALATION_SPRAY | Freq: Four times a day (QID) | RESPIRATORY_TRACT | 3 refills | Status: DC | PRN

## 2023-08-28 MED ORDER — FLUTICASONE PROPIONATE HFA 44 MCG/ACT IN AERO: 2.0000 | INHALATION_SPRAY | Freq: Two times a day (BID) | RESPIRATORY_TRACT | 5 refills | Status: DC

## 2023-08-28 NOTE — Assessment & Plan Note (Addendum)
Follow up vit D was normal. Continue vit D 3000 international units daily otc.

## 2023-08-28 NOTE — Progress Notes (Unsigned)
Subjective:     Patient ID: Gabrielle Day, female    DOB: July 27, 1951, 72 y.o.   MRN: [id number]  Chief Complaint  Patient presents with   Annual Exam    HPI  Discussed the use of AI scribe software for clinical note transcription with the patient, who gave verbal consent to proceed.  72 year old female presents to the clinic today for her annual exam. She is with her husband in the office today. No areas of concern noted with patient today. She did ask for refills of her albuterol inhaler and synthroid. She states that her asthma is doing well and she is on a new inhaler Symbicort along with her Albuterol. She asked about the RSV vaccine in office today and was educated that she is able to get the vaccine if she wishes. And that it was safe for her to get the RSV vaccine and covid vaccine at the same time.   Pap smear up to date done last year Mammogram up to date done last year Dentist was yesterday Eye exam in march 2024       Health Maintenance Due  Topic Date Due   DTaP/Tdap/Td (1 - Tdap) Never done   INFLUENZA VACCINE  07/25/2023   COVID-19 Vaccine (4 - 2023-24 season) 08/25/2023    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    Family History  Problem Relation Age of Onset   Alzheimer's disease Father    Stroke Brother    Asthma Son     Social History   Socioeconomic History   Marital status: Divorced    Spouse name: Visual merchandiser   Number of children: 2   Years of education: Not on file   Highest education level: Not on file  Occupational History   Not on file  Tobacco Use    Smoking status: Never   Smokeless tobacco: Never  Vaping Use   Vaping status: Never Used  Substance and Sexual Activity   Alcohol use: Never   Drug use: Never   Sexual activity: Yes    Partners: Male  Other Topics Concern   Not on file  Social History Narrative   Married   2 grown children, 4 grandchildren (live in [place])   Futures trader   Enjoys reading   Grew up in [place], moved to [place] at age 66   Bachelor degree in history   Social Determinants of Health   Financial Resource Strain: Not on file  Food Insecurity: Low Risk  (08/27/2023)   Received from [facility]   Hunger Vital Sign    Worried About Running Out of Food in the Last Year: Never true    Ran Out of Food in the Last Year: Never true  Transportation Needs: No Transportation Needs (08/27/2023)   Received from [facility]    In the past 12 months, has lack of reliable transportation kept you from medical appointments, meetings, work or from getting things needed for daily living? : No  Physical Activity: Not on file  Stress: Not on file  Social Connections: Not on file  Intimate Partner Violence: Not on file    Outpatient Medications Prior to Visit  Medication Sig Dispense Refill   albuterol (PROVENTIL) (2.5 MG/3ML) 0.083% nebulizer solution Take 3 mLs (2.5 mg total) by nebulization every 6 (six) hours as needed for wheezing or shortness of breath. 75 mL 1   albuterol (VENTOLIN HFA) 108 (90 Base) MCG/ACT inhaler Inhale 2 puffs into the lungs every 6 (six) hours as needed. 18 g 3   alendronate (FOSAMAX) 70 MG tablet Take 1 tablet (70 mg total) by mouth every 7 (seven) days. Take with a full glass of water on an empty stomach and sit upright for 90 minutes after taking 12 tablet 4   Azelastine HCl 137 MCG/SPRAY SOLN Place 2 sprays into the nose in the morning and at bedtime. 30 mL 5   budesonide-formoterol (SYMBICORT) 80-4.5 MCG/ACT inhaler Inhale 2 puffs into the lungs 2 (two) times daily. 1 each 3    Calcium Carbonate-Vit D-Min (CALTRATE 600+D PLUS MINERALS) 600-800 MG-UNIT CHEW One chew twice daily. 60 tablet 0   cefpodoxime (VANTIN) 200 MG tablet Take 200 mg by mouth 2 (two) times daily.     Cholecalciferol (VITAMIN D3) 75 MCG (3000 UT) TABS Take 1 tablet by mouth daily at 6 (six) AM. 30 tablet    fluticasone (FLONASE) 50 MCG/ACT nasal spray Place 2 sprays into both nostrils daily. 16 g 6   levocetirizine (XYZAL) 5 MG tablet Take 5 mg by mouth every evening.     levothyroxine (SYNTHROID) 25 MCG tablet Take 1 tablet (25 mcg total) by mouth daily. 90 tablet 1   montelukast (SINGULAIR) 10 MG tablet Take 1 tablet (10 mg total) by mouth at bedtime. 90 tablet 1   Nebulizer MISC Use with nebulized medication q 6 hours prn 1 each 0   ondansetron (ZOFRAN) 4 MG tablet Take 1 tablet (4 mg total) by mouth every 8 (eight) hours as needed for nausea or vomiting. 20 tablet 0   Respiratory Therapy Supplies (NEBULIZER MASK ADULT) MISC Use with nebulized medication q 6 hours prn 1 each 0   Vitamin D, Ergocalciferol, (DRISDOL) 1.25 MG (50000 UNIT) CAPS capsule Take 1 capsule (50,000 Units total) by mouth every 7 (seven) days. 12 capsule 0   No facility-administered medications prior to visit.    Allergies  Allergen Reactions   Penicillins     Review of Systems  Constitutional:  Negative for chills and weight loss.  HENT:  Negative for congestion, ear pain, sore throat and tinnitus.   Eyes:  Negative for blurred vision and double vision.  Respiratory:  Negative for cough, shortness of breath, wheezing and stridor.   Cardiovascular:  Negative for chest pain.  Gastrointestinal:  Negative for diarrhea, nausea and vomiting.  Genitourinary:  Negative for dysuria.  Musculoskeletal:  Negative for back pain and neck pain.  Neurological:  Negative for dizziness and headaches.  Psychiatric/Behavioral:  Negative for depression and suicidal ideas.        Objective:    Physical Exam Vitals reviewed.   Constitutional:      Appearance: Normal appearance. She is normal weight.  HENT:     Head: Normocephalic.     Right Ear: Tympanic membrane and ear canal normal.     Nose: Nose normal.     Mouth/Throat:     Mouth: Mucous membranes are moist.  Eyes:     Pupils: Pupils are equal, round, and reactive to light.  Cardiovascular:     Rate and Rhythm: Normal rate and regular rhythm.     Pulses: Normal pulses.     Heart sounds: Normal heart sounds.  Pulmonary:     Effort: Pulmonary effort is normal.     Breath sounds: Normal breath sounds.  Abdominal:     General: Abdomen is flat. Bowel sounds are normal.     Palpations: Abdomen is soft.  Musculoskeletal:        General: Normal range of motion.     Cervical back: Normal range of motion.  Skin:    General: Skin is warm.     Capillary Refill: Capillary refill takes 2 to 3 seconds.  Neurological:     General: No focal deficit present.     Mental Status: She is alert and oriented to person, place, and time. Mental status is at baseline.  Psychiatric:        Mood and Affect: Mood normal.        Behavior: Behavior normal.        Thought Content: Thought content normal.        Judgment: Judgment normal.      BP 117/65 (BP Location: Right Arm, Patient Position: Sitting, Cuff Size: Small)   Pulse 62   Temp 98.6 F (37 C) (Oral)   Resp 16   Ht 5\' 3"  (1.6 m)   Wt 111 lb (50.3 kg)   SpO2 99%   BMI 19.66 kg/m  Wt Readings from Last 3 Encounters:  08/28/23 111 lb (50.3 kg)  08/14/23 108 lb (49 kg)  08/05/23 109 lb (49.4 kg)       Assessment & Plan:   Problem List Items Addressed This Visit   None   I am having Hadiya Greeno "[name]" maintain her Vitamin D3, levocetirizine, montelukast, levothyroxine, Caltrate 600+D Plus Minerals, alendronate, Vitamin D (Ergocalciferol), ondansetron, Azelastine HCl, fluticasone, albuterol, albuterol, Nebulizer, Nebulizer Mask Adult, cefpodoxime, and budesonide-formoterol.  No orders of  the defined types were placed in this encounter.

## 2023-08-28 NOTE — Progress Notes (Unsigned)
Subjective:     Patient ID: Gabrielle Day, female    DOB: 1951-06-29, 72 y.o.   MRN: [id number]  Chief Complaint  Patient presents with   Annual Exam    HPI  Discussed the use of AI scribe software for clinical note transcription with the patient, who gave verbal consent to proceed.  History of Present Illness   The patient, with a history of hypothyroidism, asthma, and hyperlipidemia, presents for a routine follow-up. She reports compliance with her current medication regimen, including thyroid medication and Symbicort for asthma. She recently had blood work done by an endocrinologist, including thyroid function tests, and is awaiting an ultrasound. She denies any urinary symptoms and requests a urine test as part of her physical exam.  The patient reports no significant improvement in asthma symptoms/recent URI symptoms with Symbicort compared to Flovent. She expresses a desire to switch back to Flovent, citing concerns that symbicort may be too strong for long term use for her.   The patient's cholesterol was slightly elevated at her last visit, but she notes that the blood was drawn postprandially. She requests a repeat cholesterol panel while fasting. She reports a healthy diet and limited physical activity, mainly housework. She is up-to-date on dental and vision exams and plans to receive the flu shot and COVID-19 booster at the pharmacy. She is also considering the RSV vaccine.      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    Family History  Problem Relation Age of Onset   Alzheimer's disease Father    Stroke Brother    Asthma Son     Social History   Socioeconomic History   Marital status: Divorced    Spouse name: Visual merchandiser   Number of children: 2   Years of education: Not on file   Highest education level: Not on file  Occupational History   Not on file  Tobacco Use   Smoking status: Never   Smokeless tobacco: Never  Vaping Use   Vaping status: Never Used  Substance and Sexual Activity   Alcohol use: Never   Drug use: Never   Sexual activity: Yes    Partners: Male  Other Topics Concern   Not on file  Social History Narrative   Married   2 grown children, 4 grandchildren (live in [place])   Homemaker   Enjoys reading   Grew up in [place], moved to [place] at age 60   Bachelor degree in history   Social Determinants of Health   Financial Resource Strain: Not on file  Food Insecurity: Low Risk  (08/27/2023)   Received from [facility]   Hunger Vital Sign    Worried About Running Out of Food in the Last Year: Never true    Ran Out of Food in the Last Year:  Never true  Transportation Needs: No Transportation Needs (08/27/2023)   Received from [facility]    In the past 12 months, has lack of reliable transportation kept you from medical appointments, meetings, work or from getting things needed for daily living? : No  Physical Activity: Not on file  Stress: Not on file  Social Connections: Not on file  Intimate Partner Violence: Not on file    Outpatient Medications Prior to Visit  Medication Sig Dispense Refill   albuterol (PROVENTIL) (2.5 MG/3ML) 0.083% nebulizer solution Take 3 mLs (2.5 mg total) by nebulization every 6 (six) hours as needed for wheezing or shortness of breath. 75 mL 1   alendronate (FOSAMAX) 70 MG tablet Take 1 tablet (70  mg total) by mouth every 7 (seven) days. Take with a full glass of water on an empty stomach and sit upright for 90 minutes after taking 12 tablet 4   Azelastine HCl 137 MCG/SPRAY SOLN Place 2 sprays into the nose in the morning and at bedtime. 30 mL 5   Calcium Carbonate-Vit D-Min (CALTRATE 600+D PLUS MINERALS) 600-800 MG-UNIT CHEW One chew twice daily. 60 tablet 0   cefpodoxime (VANTIN) 200 MG tablet Take 200 mg by mouth 2 (two) times daily.     Cholecalciferol (VITAMIN D3) 75 MCG (3000 UT) TABS Take 1 tablet by mouth daily at 6 (six) AM. 30 tablet    fluticasone (FLONASE) 50 MCG/ACT nasal spray Place 2 sprays into both nostrils daily. 16 g 6   levocetirizine (XYZAL) 5 MG tablet Take 5 mg by mouth every evening.     montelukast (SINGULAIR) 10 MG tablet Take 1 tablet (10 mg total) by mouth at bedtime. 90 tablet 1   Nebulizer MISC Use with nebulized medication q 6 hours prn 1 each 0   ondansetron (ZOFRAN) 4 MG tablet Take 1 tablet (4 mg total) by mouth every 8 (eight) hours as needed for nausea or vomiting. 20 tablet 0   Respiratory Therapy Supplies (NEBULIZER MASK ADULT) MISC Use with nebulized medication q 6 hours prn 1 each 0   Vitamin D, Ergocalciferol, (DRISDOL) 1.25 MG (50000 UNIT) CAPS capsule Take 1 capsule (50,000 Units total) by mouth every 7 (seven) days. 12 capsule 0   albuterol (VENTOLIN HFA) 108 (90 Base) MCG/ACT inhaler Inhale 2 puffs into the lungs every 6 (six) hours as needed. 18 g 3   budesonide-formoterol (SYMBICORT) 80-4.5 MCG/ACT inhaler Inhale 2 puffs into the lungs 2 (two) times daily. 1 each 3   levothyroxine (SYNTHROID) 25 MCG tablet Take 1 tablet (25 mcg total) by mouth daily. 90 tablet 1   No facility-administered medications prior to visit.    Allergies  Allergen Reactions   Penicillins     Review of Systems  Constitutional:  Negative for weight loss.  HENT:  Negative for congestion.   Eyes:  Negative for blurred vision.  Respiratory:  Negative for cough.    Cardiovascular:  Negative for leg swelling.  Gastrointestinal:  Negative for constipation and diarrhea.  Genitourinary:  Negative for dysuria and frequency.  Musculoskeletal:  Negative for joint pain and myalgias.  Neurological:  Negative for headaches.  Psychiatric/Behavioral:         Denies depression/anxiety       Objective:    Physical Exam   BP 117/65 (BP Location: Right Arm, Patient Position: Sitting, Cuff Size: Small)   Pulse 62   Temp 98.6 F (37 C) (Oral)   Resp 16   Ht 5\' 3"  (  1.6 m)   Wt 111 lb (50.3 kg)   SpO2 99%   BMI 19.66 kg/m  Wt Readings from Last 3 Encounters:  08/28/23 111 lb (50.3 kg)  08/14/23 108 lb (49 kg)  08/05/23 109 lb (49.4 kg)   Physical Exam  Constitutional: She is oriented to person, place, and time. She appears well-developed and well-nourished. No distress.  HENT:  Head: Normocephalic and atraumatic.  Right Ear: Tympanic membrane and ear canal normal.  Left Ear: Tympanic membrane and ear canal normal.  Mouth/Throat: Oropharynx is clear and moist.  Eyes: Pupils are equal, round, and reactive to light. No scleral icterus.  Neck: Normal range of motion. No thyromegaly present.  Cardiovascular: Normal rate and regular rhythm.   No murmur heard. Pulmonary/Chest: Effort normal and breath sounds normal. No respiratory distress. He has no wheezes. She has no rales. She exhibits no tenderness.  Abdominal: Soft. Bowel sounds are normal. She exhibits no distension and no mass. There is no tenderness. There is no rebound and no guarding.  Musculoskeletal: She exhibits no edema.  Lymphadenopathy:    She has no cervical adenopathy.  Neurological: She is alert and oriented to person, place, and time. She has normal patellar reflexes. She exhibits normal muscle tone. Coordination normal.  Skin: Skin is warm and dry.  Psychiatric: She has a normal mood and affect. Her behavior is normal. Judgment and thought content normal.  Breast/pelvic:  deferred           Assessment & Plan:       Assessment & Plan:   Problem List Items Addressed This Visit       Unprioritized   Vitamin D deficiency    Follow up vit D was normal. Continue vit D 3000 international units daily otc.      Preventative health care    Continue healthy diet. Encouraged her to work towards 30 minutes of walking 5 days a week. Cologuard complete and negative.  Pap and mammo per GYN [facility]).      Relevant Orders   Urinalysis, Routine w reflex microscopic (Completed)   Multiple thyroid nodules    Stable. Continue Synthroid at this time       Relevant Medications   levothyroxine (SYNTHROID) 25 MCG tablet   Migraine - Primary    Stable. No concerns noted       Hyperlipidemia   Relevant Orders   Lipid panel (Completed)   Depression    No episodes noted at this time       Community acquired pneumonia    Resolved.       Relevant Medications   albuterol (VENTOLIN HFA) 108 (90 Base) MCG/ACT inhaler   fluticasone (FLOVENT HFA) 44 MCG/ACT inhaler   Asthma    Stable/improved. Advised pt ok to switch back to flovent from symbicort when she is ready.  Discussed if she finds that she needs to use albuterol more than 2 x a week on flovent then it would be best to be on symbicort. Pt verbalizes understanding.      Relevant Medications   albuterol (VENTOLIN HFA) 108 (90 Base) MCG/ACT inhaler   fluticasone (FLOVENT HFA) 44 MCG/ACT inhaler   Other Visit Diagnoses     History of anemia       Relevant Orders   CBC w/Diff (Completed)   Need for Tdap vaccination       Relevant Orders   Tdap vaccine greater than or equal to 7yo IM (Completed)  I have discontinued Merridy Ruffolo "[name]"'s budesonide-formoterol. I am also having her start on fluticasone. Additionally, I am having her maintain her Vitamin D3, levocetirizine, montelukast, Caltrate 600+D Plus Minerals, alendronate, Vitamin D (Ergocalciferol), ondansetron,  Azelastine HCl, fluticasone, albuterol, Nebulizer, Nebulizer Mask Adult, cefpodoxime, albuterol, and levothyroxine.  Meds ordered this encounter  Medications   albuterol (VENTOLIN HFA) 108 (90 Base) MCG/ACT inhaler    Sig: Inhale 2 puffs into the lungs every 6 (six) hours as needed.    Dispense:  18 g    Refill:  3    Please dispense with spacer.    Order Specific Question:   Supervising Provider    Answer:   Bradd Canary [[id number]]   levothyroxine (SYNTHROID) 25 MCG tablet    Sig: Take 1 tablet (25 mcg total) by mouth daily.    Dispense:  90 tablet    Refill:  1    Order Specific Question:   Supervising Provider    Answer:   Danise Edge A [[id number]]   fluticasone (FLOVENT HFA) 44 MCG/ACT inhaler    Sig: Inhale 2 puffs into the lungs 2 (two) times daily.    Dispense:  1 each    Refill:  5    Order Specific Question:   Supervising Provider    Answer:   Danise Edge A [id number]

## 2023-08-28 NOTE — Assessment & Plan Note (Signed)
Stable. No concerns noted

## 2023-08-28 NOTE — Assessment & Plan Note (Signed)
Stable. Continue Synthroid at this time

## 2023-08-28 NOTE — Assessment & Plan Note (Signed)
Resolved

## 2023-08-28 NOTE — Assessment & Plan Note (Addendum)
Continue healthy diet. Encouraged her to work towards 30 minutes of walking 5 days a week. Cologuard complete and negative.  Pap and mammo per GYN [facility]).

## 2023-08-28 NOTE — Assessment & Plan Note (Signed)
No episodes noted at this time

## 2023-08-28 NOTE — Assessment & Plan Note (Addendum)
Stable/improved. Advised pt ok to switch back to flovent from symbicort when she is ready.  Discussed if she finds that she needs to use albuterol more than 2 x a week on flovent then it would be best to be on symbicort. Pt verbalizes understanding.

## 2023-08-29 NOTE — Patient Instructions (Signed)
VISIT SUMMARY:  During your recent visit, we discussed your ongoing conditions of hypothyroidism, asthma, and hyperlipidemia, as well as your vitamin D deficiency. You expressed a desire to switch your asthma medication from Symbicort back to Flovent due to side effects. We also discussed your recent blood work and plans for further tests, including a repeat cholesterol panel and a urine test.  YOUR PLAN:  -VITAMIN D DEFICIENCY: You have not been taking Vitamin D supplements, which are important for bone health and overall wellbeing. We will start you on Vitamin D 3000 units daily.  -HYPOTHYROIDISM: This is a condition where your thyroid gland doesn't produce enough hormones. We will refill your thyroid medication prescription.  -ASTHMA: This is a condition that affects your airways, making it difficult to breathe. We will switch your medication from Symbicort to Flovent. Continue using Albuterol as needed. If you need to use Albuterol more than twice a week, we will switch you back to Symbicort.  -HYPERLIPIDEMIA: This is a condition where there are high levels of fats in your blood. We will repeat your cholesterol panel in a fasting state to get a more accurate reading.  -GENERAL HEALTH MAINTENANCE: We administered a tetanus shot during your visit. Continue with your annual mammograms with your gynecologist. We will repeat the Cologuard test next summer. Consider getting the RSV vaccine available at the pharmacy. It's also important to maintain regular exercise, aiming for 30 minutes, 5 days a week.  INSTRUCTIONS:  Please start taking Vitamin D 3000 units daily. We have refilled your thyroid medication prescription. Switch your asthma medication from Symbicort to Flovent and continue using Albuterol as needed. If you need to use Albuterol more than twice a week, please contact us. We will arrange for a repeat cholesterol panel in a fasting state. Please continue with your annual mammograms with  your gynecologist and consider getting the RSV vaccine available at the pharmacy. Aim to exercise regularly, for 30 minutes, 5 days a week. We will follow up in 6 months.

## 2023-09-03 ENCOUNTER — Encounter: Payer: Federal, State, Local not specified - PPO | Admitting: Family

## 2023-09-04 LAB — HM MAMMOGRAPHY

## 2023-09-18 ENCOUNTER — Ambulatory Visit: Payer: Federal, State, Local not specified - PPO | Admitting: Internal Medicine

## 2023-09-30 ENCOUNTER — Ambulatory Visit: Payer: Federal, State, Local not specified - PPO | Admitting: Family

## 2023-10-02 ENCOUNTER — Institutional Professional Consult (permissible substitution): Payer: Federal, State, Local not specified - PPO | Admitting: Pulmonary Disease

## 2023-10-09 ENCOUNTER — Ambulatory Visit: Payer: Federal, State, Local not specified - PPO | Admitting: Family

## 2023-10-28 ENCOUNTER — Ambulatory Visit: Payer: Federal, State, Local not specified - PPO | Admitting: Family

## 2023-12-24 ENCOUNTER — Ambulatory Visit: Payer: Federal, State, Local not specified - PPO | Admitting: Medical

## 2023-12-24 VITALS — BP 130/80 | HR 89 | Temp 98.0°F | Resp 18 | Ht 63.0 in | Wt 111.2 lb

## 2023-12-24 DIAGNOSIS — R062 Wheezing: Secondary | ICD-10-CM | POA: Diagnosis not present

## 2023-12-24 DIAGNOSIS — J40 Bronchitis, not specified as acute or chronic: Secondary | ICD-10-CM | POA: Diagnosis not present

## 2023-12-24 DIAGNOSIS — R051 Acute cough: Secondary | ICD-10-CM

## 2023-12-24 DIAGNOSIS — J45909 Unspecified asthma, uncomplicated: Secondary | ICD-10-CM

## 2023-12-24 MED ORDER — BENZONATATE 100 MG PO CAPS: 100.0000 mg | ORAL_CAPSULE | Freq: Three times a day (TID) | ORAL | 0 refills | Status: DC | PRN

## 2023-12-24 MED ORDER — AZELASTINE HCL 137 MCG/SPRAY NA SOLN: 2.0000 | Freq: Two times a day (BID) | NASAL | 5 refills | Status: DC

## 2023-12-24 MED ORDER — ALBUTEROL SULFATE HFA 108 (90 BASE) MCG/ACT IN AERS
2.0000 | INHALATION_SPRAY | Freq: Four times a day (QID) | RESPIRATORY_TRACT | 3 refills | Status: DC | PRN
Start: 2023-12-24 — End: 2024-03-11

## 2023-12-24 MED ORDER — AZITHROMYCIN 250 MG PO TABS: ORAL_TABLET | ORAL | 0 refills | Status: AC

## 2023-12-24 MED ORDER — BUDESONIDE-FORMOTEROL FUMARATE 160-4.5 MCG/ACT IN AERO: 2.0000 | INHALATION_SPRAY | Freq: Two times a day (BID) | RESPIRATORY_TRACT | 3 refills | Status: DC

## 2023-12-24 NOTE — Progress Notes (Signed)
   Subjective:    Patient ID: Gabrielle Day, female    DOB: 07-31-1951, 72 y.o.   MRN: [id number]  HPI Discussed the use of AI scribe software for clinical note transcription with the patient, who gave verbal consent to proceed.  History of Present Illness   The patient, with a history of allergies and respiratory issues, presents with a ten-day history of upper respiratory symptoms. The illness began with a sore throat and postnasal drip, which has persisted. They describe the mucus as green and copious.  The patient also reports a productive cough with colored sputum, which has been consistent throughout the illness. They have been experiencing wheezing for the past few days, which they manage with Symbicort  and abluterol. They have requested refills for these medications, as well as Astelin  for their allergies.  The patient has been self-medicating with Mucinex  for their cough. They have not reported any improvement in their symptoms over the ten-day course of the illness. They have not had any imaging studies done to date.        Review of Systems See hpi    Objective:   Physical Exam  General- No acute distress. Pleasant patient. Neck- Full range of motion, no jvd Lungs- Clear, even and unlabored. Heart- regular rate and rhythm. Neurologic- CNII- XII grossly intact.  Heent- moderate nasal congestion. No sinus pressure. Boggy turbinates. +pnd.      Assessment & Plan:  Assessment and Plan    Upper Respiratory Infection progressing to Bronchitis 10-day history of sore throat, runny nose, postnasal drainage, productive cough with colored mucus, and wheezing. On examination, maxillary sinus pressure noted. -Start Azithromycin  for suspected bronchitis. -Continue use of Symbicort  and Albuterol  for wheezing as needed. -Start benzonatate  for cough. -Consider chest x-ray if symptoms persist or worsen, and notify the office.  Allergic Rhinitis History of allergies with request  for Astelin  refill. -Refill Astelin  to help with postnasal drainage and potential allergic component.  Follow-up in 7-10 days if symptoms persist or worsen. If symptoms resolve completely, no need for follow-up. Patient can update via MyChart in about 7 days.

## 2023-12-24 NOTE — Patient Instructions (Signed)
 Upper Respiratory Infection progressing to Bronchitis 10-day history of sore throat, runny nose, postnasal drainage, productive cough with colored mucus, and wheezing. On examination, maxillary sinus pressure noted. -Start Azithromycin  for suspected bronchitis. -Continue use of Symbicort  and Albuterol  for wheezing as needed. -Start benzonatate  for cough. -Consider chest x-ray if symptoms persist or worsen, and notify the office.  Allergic Rhinitis History of allergies with request for Astelin  refill. -Refill Astelin  to help with postnasal drainage and potential allergic component.  Follow-up in 7-10 days if symptoms persist or worsen. If symptoms resolve completely, no need for follow-up. Patient can update via MyChart in about 7 days.

## 2023-12-25 ENCOUNTER — Encounter: Payer: Self-pay | Admitting: Medical

## 2023-12-26 MED ORDER — BUDESONIDE-FORMOTEROL FUMARATE 160-4.5 MCG/ACT IN AERO: 2.0000 | INHALATION_SPRAY | Freq: Two times a day (BID) | RESPIRATORY_TRACT | 3 refills | Status: DC

## 2023-12-26 NOTE — Addendum Note (Signed)
 Addended by: Gwenevere Abbot on: 12/26/2023 08:51 AM   Modules accepted: Orders

## 2024-01-29 ENCOUNTER — Other Ambulatory Visit: Payer: Self-pay | Admitting: Family

## 2024-03-11 ENCOUNTER — Ambulatory Visit: Payer: Federal, State, Local not specified - PPO | Admitting: Family

## 2024-03-11 VITALS — BP 119/52 | HR 62 | Temp 97.7°F | Resp 16 | Ht 63.0 in | Wt 111.0 lb

## 2024-03-11 DIAGNOSIS — E890 Postprocedural hypothyroidism: Secondary | ICD-10-CM | POA: Diagnosis not present

## 2024-03-11 DIAGNOSIS — R110 Nausea: Secondary | ICD-10-CM | POA: Insufficient documentation

## 2024-03-11 DIAGNOSIS — J4540 Moderate persistent asthma, uncomplicated: Secondary | ICD-10-CM

## 2024-03-11 DIAGNOSIS — M810 Age-related osteoporosis without current pathological fracture: Secondary | ICD-10-CM

## 2024-03-11 DIAGNOSIS — J45909 Unspecified asthma, uncomplicated: Secondary | ICD-10-CM

## 2024-03-11 MED ORDER — AZELASTINE HCL 137 MCG/SPRAY NA SOLN: 2.0000 | Freq: Two times a day (BID) | NASAL | 5 refills | Status: DC

## 2024-03-11 MED ORDER — MONTELUKAST SODIUM 10 MG PO TABS: 10.0000 mg | ORAL_TABLET | Freq: Every day | ORAL | 1 refills | Status: AC

## 2024-03-11 MED ORDER — ALBUTEROL SULFATE HFA 108 (90 BASE) MCG/ACT IN AERS: 2.0000 | INHALATION_SPRAY | Freq: Four times a day (QID) | RESPIRATORY_TRACT | 3 refills | Status: DC | PRN

## 2024-03-11 MED ORDER — BUDESONIDE-FORMOTEROL FUMARATE 80-4.5 MCG/ACT IN AERO: 2.0000 | INHALATION_SPRAY | Freq: Two times a day (BID) | RESPIRATORY_TRACT | 3 refills | Status: DC

## 2024-03-11 MED ORDER — ONDANSETRON HCL 4 MG PO TABS: 4.0000 mg | ORAL_TABLET | Freq: Three times a day (TID) | ORAL | 0 refills | Status: DC | PRN

## 2024-03-11 NOTE — Patient Instructions (Signed)
 VISIT SUMMARY:  Today, we reviewed your asthma management, bone health, thyroid function, and cholesterol levels. Your asthma is well-controlled with your current medications, and we discussed your preferences for dosage. We also talked about your bone density and potential treatments, as well as your thyroid and cholesterol management.  YOUR PLAN:  -ASTHMA: Asthma is a condition where your airways narrow and swell, making it difficult to breathe. Your asthma is well-controlled with Symbicort 80 mcg and Singulair. You should continue using Symbicort 80 mcg for maintenance and Singulair for both asthma and allergies. Use Albuterol as needed for acute symptoms. Monitor your symptoms and adjust medication as necessary.  -OSTEOPOROSIS: Osteoporosis is a condition where bones become weak and brittle. Your bone density is very low, and we discussed treatment options like once monthly Boniva pills and Reclast annual infusion and prolia injections which are given every 6 months in the office.  Continue taking calcium and vitamin D supplements. Discuss these options with your daughter and let me know if you wish to begin one of these treatments.  -HYPOTHYROIDISM: Hypothyroidism is when your thyroid gland doesn't produce enough hormones. Your condition is well-managed with your current dose of levothyroxine, and your recent thyroid function tests are normal. Continue with your current dosage.  -HYPERLIPIDEMIA: Hyperlipidemia is when you have high levels of cholesterol in your blood. Your cholesterol is mildly elevated, but no medication is needed at this time. Focus on maintaining a healthy diet to manage your cholesterol levels.  INSTRUCTIONS:  Please discuss the osteoporosis treatment options with your daughter and schedule a follow-up appointment to continue this discussion. Continue monitoring your asthma symptoms and adjust your medication as needed. Maintain your current thyroid medication dosage and  focus on a healthy diet to manage your cholesterol levels.

## 2024-03-11 NOTE — Progress Notes (Signed)
 Subjective:     Patient ID: Gabrielle Day, female    DOB: 05/16/1951, 73 y.o.   MRN: [id number]  Chief Complaint  Patient presents with   Vitamin D deficiency    Here for follow up   Asthma    Here for follow up    Asthma Her past medical history is significant for asthma.    Discussed the use of AI scribe software for clinical note transcription with the patient, who gave verbal consent to proceed.  History of Present Illness   Gabrielle Day "[name]" is a 73 year old female with asthma who presents for a regular follow-up visit.  Her asthma is generally well-controlled, but she experiences exacerbations when she becomes ill. Recently, she was prescribed a higher dose of Symbicort (160 mcg), which she felt was too high, as she normally takes 80 mcg. She prefers to return to the 80 mcg dose, as her breathing is currently stable. She uses Symbicort throughout the year to maintain control over her asthma and acknowledges that it helps keep her symptoms 'quiet'. She also uses albuterol as needed. Additionally, she takes Singulair, which she finds helpful for both her allergies and asthma. She inquires about the possibility of using Singulair in conjunction with Symbicort, which she currently does. No current wheezing or breathing difficulties are reported.  She mentions a history of hair loss and a concern about taking too many medications. She is currently taking levothyroxine for her thyroid, which was recently checked and found to be normal. She also takes vitamin D and calcium supplements for bone health, as she has a history of low bone density. She is not currently taking Fosamax or any other osteoporosis medication, expressing a preference to manage her bone health through diet and exercise.    There are no preventive care reminders to display for this patient.  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    Family History  Problem Relation Age of Onset   Alzheimer's disease Father    Stroke Brother    Asthma Son     Social History   Socioeconomic History   Marital status: Divorced    Spouse name: Visual merchandiser   Number of children: 2   Years of education: Not on file   Highest education level: Not on file  Occupational History   Not on file  Tobacco Use   Smoking status: Never   Smokeless tobacco: Never  Vaping Use   Vaping status: Never Used  Substance and Sexual Activity   Alcohol use: Never   Drug use: Never   Sexual activity: Yes    Partners: Male  Other Topics Concern   Not on file  Social History Narrative   Married   2 grown children, 4 grandchildren (live in [place])   Homemaker   Enjoys reading   Grew up in [place], moved to [place] at age 51   Bachelor degree in history   Social Drivers of Corporate investment banker Strain: Not on file  Food Insecurity: Low Risk  (  08/27/2023)   Received from [facility]   Hunger Vital Sign    Worried About Running Out of Food in the Last Year: Never true    Ran Out of Food in the Last Year: Never true  Transportation Needs: No Transportation Needs (08/27/2023)   Received from [facility]    In the past 12 months, has lack of reliable transportation kept you from medical appointments, meetings, work or from getting things needed for daily living? : No  Physical Activity: Not on file  Stress: Not on file  Social Connections: Unknown (08/10/2023)   Received from [facility]   Social Network    Social Network: Not on file  Intimate Partner Violence: Unknown (08/10/2023)   Received from [facility]   HITS    Physically Hurt: Not on file    Insult or Talk Down To: Not on file    Threaten Physical Harm: Not on file    Scream or Curse: Not on file    Outpatient Medications Prior to Visit  Medication Sig Dispense Refill   alendronate (FOSAMAX) 70 MG tablet Take 1 tablet (70 mg total) by mouth every 7 (seven) days. Take with a full glass of water on an empty stomach and sit upright for 90 minutes after taking (Patient not taking: Reported on 03/11/2024) 12 tablet 4   benzonatate (TESSALON) 100 MG capsule Take 1 capsule (100 mg total) by mouth 3 (three) times daily as needed for cough. 30 capsule 0   Calcium Carbonate-Vit D-Min (CALTRATE 600+D PLUS MINERALS) 600-800 MG-UNIT CHEW One chew twice daily. 60 tablet 0   Cholecalciferol (VITAMIN D3) 75 MCG (3000 UT) TABS Take 1 tablet by mouth daily at 6 (six) AM. 30 tablet    fluticasone (FLONASE) 50 MCG/ACT nasal spray Place 2 sprays into both nostrils daily. 16 g 6   fluticasone (FLOVENT HFA) 44 MCG/ACT inhaler Inhale 2 puffs into the lungs 2 (two) times daily. 1 each 5   levocetirizine (XYZAL) 5 MG tablet Take 5 mg by mouth every evening.     levothyroxine (SYNTHROID) 25 MCG tablet Take 1 tablet (25 mcg total) by mouth daily. 90 tablet 1   Nebulizer MISC Use with nebulized medication q 6 hours prn 1 each 0   Respiratory Therapy Supplies (NEBULIZER MASK ADULT) MISC Use with nebulized medication q 6 hours prn 1 each 0   albuterol (PROVENTIL) (2.5 MG/3ML) 0.083% nebulizer solution Take 3 mLs (2.5 mg total) by nebulization every 6 (six) hours as needed for wheezing or shortness of breath. 75 mL 1   albuterol (VENTOLIN HFA) 108 (90 Base) MCG/ACT inhaler Inhale 2 puffs into the lungs every 6 (six) hours as needed. 18 g 3   Azelastine HCl 137 MCG/SPRAY SOLN Place 2 sprays into the nose in the morning and at bedtime. 30 mL 5   budesonide-formoterol (SYMBICORT) 160-4.5 MCG/ACT inhaler Inhale 2 puffs into the lungs 2 (two) times daily. 1 each 3    budesonide-formoterol (SYMBICORT) 160-4.5 MCG/ACT inhaler Inhale 2 puffs into the lungs 2 (two) times daily. 1 each 3   cefpodoxime (VANTIN) 200 MG tablet Take 200 mg by mouth 2 (two) times daily.     montelukast (SINGULAIR) 10 MG tablet Take 1 tablet (10 mg total) by mouth at bedtime. 90 tablet 1   ondansetron (ZOFRAN) 4 MG tablet Take 1 tablet (4 mg total) by mouth every 8 (eight) hours as needed for nausea or vomiting. 20 tablet 0   Vitamin D,  Ergocalciferol, (DRISDOL) 1.25 MG (50000 UNIT) CAPS capsule Take 1 capsule (50,000 Units total) by mouth every 7 (seven) days. 12 capsule 0   No facility-administered medications prior to visit.    Allergies  Allergen Reactions   Penicillins     ROS See HPI    Objective:    Physical Exam Constitutional:      General: She is not in acute distress.    Appearance: Normal appearance. She is well-developed.  HENT:     Head: Normocephalic and atraumatic.     Right Ear: External ear normal.     Left Ear: External ear normal.  Eyes:     General: No scleral icterus. Neck:     Thyroid: No thyromegaly.  Cardiovascular:     Rate and Rhythm: Normal rate and regular rhythm.     Heart sounds: Normal heart sounds. No murmur heard. Pulmonary:     Effort: Pulmonary effort is normal. No respiratory distress.     Breath sounds: Normal breath sounds. No wheezing.  Musculoskeletal:     Cervical back: Neck supple.  Skin:    General: Skin is warm and dry.  Neurological:     Mental Status: She is alert and oriented to person, place, and time.  Psychiatric:        Mood and Affect: Mood normal.        Behavior: Behavior normal.        Thought Content: Thought content normal.        Judgment: Judgment normal.      BP (!) 119/52 (BP Location: Right Arm, Patient Position: Sitting, Cuff Size: Small)   Pulse 62   Temp 97.7 F (36.5 C) (Oral)   Resp 16   Ht 5\' 3"  (1.6 m)   Wt 111 lb (50.3 kg)   SpO2 100%   BMI 19.66 kg/m  Wt Readings from Last 3  Encounters:  03/11/24 111 lb (50.3 kg)  12/24/23 111 lb 3.2 oz (50.4 kg)  08/28/23 111 lb (50.3 kg)       Assessment & Plan:   Problem List Items Addressed This Visit       Unprioritized   Post-surgical hypothyroidism - Primary    Well-managed with current levothyroxine dosage. Recent TSH performed by endocrinology was normal. - Continue current dose of levothyroxine.      Osteoporosis    Very low bone density. Discussed Fosamax, Boniva, Prolia, and Reclast. Prolia and Reclast. She might consider Boniva but wishes to discuss with her daughter first. - Continue calcium and vitamin D supplementation.      Nausea   Requesting refill of odansetron to use for occasional nausea symptoms.       Relevant Medications   ondansetron (ZOFRAN) 4 MG tablet   Asthma   Asthma well-controlled with symptoms typically only occurring during illness. Prefers Symbicort 80 mcg for maintenance. Singulair used for asthma and allergy management. - Prescribe Symbicort 80 mcg in place of the 160 mcg dose. - Continue Singulair. - Prescribe Albuterol for acute symptoms. - Educated on symptom monitoring and medication adjustment.      Relevant Medications   albuterol (VENTOLIN HFA) 108 (90 Base) MCG/ACT inhaler   budesonide-formoterol (SYMBICORT) 80-4.5 MCG/ACT inhaler   montelukast (SINGULAIR) 10 MG tablet    I have discontinued Denelle Brogdon "[name]"'s Vitamin D (Ergocalciferol) and cefpodoxime. I am also having her maintain her Vitamin D3, levocetirizine, Caltrate 600+D Plus Minerals, alendronate, fluticasone, Nebulizer, Nebulizer Mask Adult, levothyroxine, fluticasone, benzonatate, albuterol, Azelastine HCl, ondansetron, budesonide-formoterol, and  montelukast.  Meds ordered this encounter  Medications   albuterol (VENTOLIN HFA) 108 (90 Base) MCG/ACT inhaler    Sig: Inhale 2 puffs into the lungs every 6 (six) hours as needed.    Dispense:  18 g    Refill:  3    Please dispense with  spacer.   Azelastine HCl 137 MCG/SPRAY SOLN    Sig: Place 2 sprays into the nose in the morning and at bedtime.    Dispense:  30 mL    Refill:  5   ondansetron (ZOFRAN) 4 MG tablet    Sig: Take 1 tablet (4 mg total) by mouth every 8 (eight) hours as needed for nausea or vomiting.    Dispense:  20 tablet    Refill:  0   budesonide-formoterol (SYMBICORT) 80-4.5 MCG/ACT inhaler    Sig: Inhale 2 puffs into the lungs 2 (two) times daily.    Dispense:  1 each    Refill:  3   montelukast (SINGULAIR) 10 MG tablet    Sig: Take 1 tablet (10 mg total) by mouth at bedtime.    Dispense:  90 tablet    Refill:  1    Supervising Provider:   Danise Edge A [[id number]]

## 2024-03-11 NOTE — Assessment & Plan Note (Signed)
  Very low bone density. Discussed Fosamax, Boniva, Prolia, and Reclast. Prolia and Reclast. She might consider Boniva but wishes to discuss with her daughter first. - Continue calcium and vitamin D supplementation.

## 2024-03-11 NOTE — Assessment & Plan Note (Addendum)
  Well-managed with current levothyroxine dosage. Recent TSH performed by endocrinology was normal. - Continue current dose of levothyroxine.

## 2024-03-11 NOTE — Assessment & Plan Note (Signed)
 Asthma well-controlled with symptoms typically only occurring during illness. Prefers Symbicort 80 mcg for maintenance. Singulair used for asthma and allergy management. - Prescribe Symbicort 80 mcg in place of the 160 mcg dose. - Continue Singulair. - Prescribe Albuterol for acute symptoms. - Educated on symptom monitoring and medication adjustment.

## 2024-03-11 NOTE — Assessment & Plan Note (Signed)
 Requesting refill of odansetron to use for occasional nausea symptoms.

## 2024-03-15 ENCOUNTER — Other Ambulatory Visit: Payer: Self-pay | Admitting: Family

## 2024-03-15 DIAGNOSIS — E042 Nontoxic multinodular goiter: Secondary | ICD-10-CM

## 2024-03-16 ENCOUNTER — Other Ambulatory Visit: Payer: Self-pay | Admitting: Family

## 2024-03-16 DIAGNOSIS — E042 Nontoxic multinodular goiter: Secondary | ICD-10-CM

## 2024-06-11 ENCOUNTER — Other Ambulatory Visit: Payer: Self-pay | Admitting: Family

## 2024-06-11 DIAGNOSIS — E042 Nontoxic multinodular goiter: Secondary | ICD-10-CM

## 2024-07-24 ENCOUNTER — Ambulatory Visit: Admitting: Family

## 2024-07-24 ENCOUNTER — Other Ambulatory Visit: Payer: Self-pay | Admitting: Family

## 2024-07-24 VITALS — BP 132/65 | HR 66 | Temp 98.2°F | Resp 16 | Ht 63.0 in | Wt 112.0 lb

## 2024-07-24 DIAGNOSIS — T753XXA Motion sickness, initial encounter: Secondary | ICD-10-CM | POA: Diagnosis not present

## 2024-07-24 DIAGNOSIS — J029 Acute pharyngitis, unspecified: Secondary | ICD-10-CM | POA: Diagnosis not present

## 2024-07-24 DIAGNOSIS — J45909 Unspecified asthma, uncomplicated: Secondary | ICD-10-CM | POA: Diagnosis not present

## 2024-07-24 DIAGNOSIS — J020 Streptococcal pharyngitis: Secondary | ICD-10-CM

## 2024-07-24 DIAGNOSIS — M810 Age-related osteoporosis without current pathological fracture: Secondary | ICD-10-CM

## 2024-07-24 DIAGNOSIS — J4540 Moderate persistent asthma, uncomplicated: Secondary | ICD-10-CM | POA: Diagnosis not present

## 2024-07-24 DIAGNOSIS — M899 Disorder of bone, unspecified: Secondary | ICD-10-CM

## 2024-07-24 DIAGNOSIS — J309 Allergic rhinitis, unspecified: Secondary | ICD-10-CM

## 2024-07-24 DIAGNOSIS — E890 Postprocedural hypothyroidism: Secondary | ICD-10-CM

## 2024-07-24 DIAGNOSIS — Z1211 Encounter for screening for malignant neoplasm of colon: Secondary | ICD-10-CM

## 2024-07-24 LAB — POCT RAPID STREP A (OFFICE): Rapid Strep A Screen: POSITIVE — AB

## 2024-07-24 MED ORDER — AZITHROMYCIN 250 MG PO TABS: ORAL_TABLET | ORAL | 0 refills | Status: AC

## 2024-07-24 MED ORDER — BUDESONIDE-FORMOTEROL FUMARATE 80-4.5 MCG/ACT IN AERO: 2.0000 | INHALATION_SPRAY | Freq: Two times a day (BID) | RESPIRATORY_TRACT | 3 refills | Status: DC

## 2024-07-24 MED ORDER — AZELASTINE HCL 137 MCG/SPRAY NA SOLN: 2.0000 | Freq: Two times a day (BID) | NASAL | 3 refills | Status: AC

## 2024-07-24 MED ORDER — SCOPOLAMINE 1 MG/3DAYS TD PT72: 1.0000 | MEDICATED_PATCH | TRANSDERMAL | 0 refills | Status: AC

## 2024-07-24 NOTE — Patient Instructions (Signed)
 VISIT SUMMARY:  You came in today because of a sore throat, nasal drainage, and headaches that have been bothering you for three days. We discussed your symptoms, checked for strep throat, and addressed your asthma and motion sickness medication needs for your upcoming cruise.  YOUR PLAN:  Strep Throat: You have a sore throat with nasal drainage and headache, but no fever. Your throat is quite red, and your rapid strep test is positive.  Please begin azithromycin  antibiotic.  -You can take acetaminophen or ibuprofen for pain, do salt water gargles, and use Chloroceptic spray. -Please let us  know if your symptoms do not improve in a couple of days.  ASTHMA: Your asthma is well-controlled, even with your current upper respiratory symptoms. -We have refilled your asthma medications as requested.  MOTION SICKNESS: You are planning a seven-day cruise and need medication to prevent motion sickness. -We have prescribed three patches for motion sickness, each lasting three days.

## 2024-07-24 NOTE — Assessment & Plan Note (Signed)
 Maintained on fosamax .

## 2024-07-24 NOTE — Assessment & Plan Note (Signed)
 Incidental finding on hip x-ray. Asymptomatic, monitor.

## 2024-07-24 NOTE — Assessment & Plan Note (Signed)
 Very mildly positive rapid strep test. Rx with azithromycin .  Recommend tylenol/motrin, salt water gargles, chloraseptic spray prn.  Call if symptoms worsen or symptoms fail to improve.

## 2024-07-24 NOTE — Assessment & Plan Note (Signed)
 Managed by endocrionology.

## 2024-07-24 NOTE — Progress Notes (Signed)
 Subjective:     Patient ID: Gabrielle Day, female    DOB: Jun 02, 1951, 73 y.o.   MRN: [id number]  Chief Complaint  Patient presents with   Sore Throat    Patient complains of sore throat x 3 days   Nasal Congestion    Patient complains of post nasal drip    Sore Throat     Discussed the use of AI scribe software for clinical note transcription with the patient, who gave verbal consent to proceed.  History of Present Illness  Gabrielle Day is a 73 year old female who presents with a sore throat and nasal drainage.  She has had a sore throat for three days, with nasal drainage and headaches. The sore throat was particularly bothersome this morning. She has no fever and denies significant cough. She was around her grandchildren three days ago and is concerned about catching viruses from children easily. Her asthma is well-controlled, and she inquires about using azelastine , confirming she can use either the generic or brand version. She is planning a seven-day cruise and requests medication refills, including patches for motion sickness.     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    Family History  Problem Relation Age of Onset   Alzheimer's disease Father    Stroke Brother    Asthma Son     Social History   Socioeconomic History   Marital status: Divorced    Spouse name: Visual merchandiser   Number  of children: 2   Years of education: Not on file   Highest education level: Not on file  Occupational History   Not on file  Tobacco Use   Smoking status: Never   Smokeless tobacco: Never  Vaping Use   Vaping status: Never Used  Substance and Sexual Activity   Alcohol use: Never   Drug use: Never   Sexual activity: Yes    Partners: Male  Other Topics Concern   Not on file  Social History Narrative   Married   2 grown children, 4 grandchildren (live in [place])   Homemaker   Enjoys reading   Grew up in [place], moved [place] age 64   Bachelor degree in history   Social Drivers of Corporate investment banker Strain: Not on file  Food Insecurity: Low Risk  (08/27/2023)   Received from [facility]   Hunger Vital Sign    Within the past 12 months, you worried that your food would run out before you got money to buy more: Never true    Within the past 12 months, the food you bought just didn't last and you didn't have money to get more. : Never true  Transportation Needs: No Transportation Needs (08/27/2023)   Received from [facility]    In the past 12 months, has  lack of reliable transportation kept you from medical appointments, meetings, work or from getting things needed for daily living? : No  Physical Activity: Not on file  Stress: Not on file  Social Connections: Unknown (08/10/2023)   Received from [facility]   Social Network    Social Network: Not on file  Intimate Partner Violence: Unknown (08/10/2023)   Received from [facility]   HITS    Physically Hurt: Not on file    Insult or Talk Down To: Not on file    Threaten Physical Harm: Not on file    Scream or Curse: Not on file    Outpatient Medications Prior to Visit  Medication Sig Dispense Refill   albuterol  (VENTOLIN  HFA) 108 (90 Base) MCG/ACT inhaler Inhale 2 puffs into the lungs every 6 (six) hours as needed. 18 g 3   Calcium Carbonate-Vit D-Min (CALTRATE 600+D PLUS MINERALS) 600-800  MG-UNIT CHEW One chew twice daily. 60 tablet 0   Cholecalciferol (VITAMIN D3) 75 MCG (3000 UT) TABS Take 1 tablet by mouth daily at 6 (six) AM. 30 tablet    levocetirizine (XYZAL) 5 MG tablet Take 5 mg by mouth every evening.     levothyroxine  (SYNTHROID ) 25 MCG tablet Take 1 tablet (25 mcg total) by mouth daily before breakfast. Needs appt 30 tablet 0   montelukast  (SINGULAIR ) 10 MG tablet Take 1 tablet (10 mg total) by mouth at bedtime. 90 tablet 1   Nebulizer MISC Use with nebulized medication q 6 hours prn 1 each 0   ondansetron  (ZOFRAN ) 4 MG tablet Take 1 tablet (4 mg total) by mouth every 8 (eight) hours as needed for nausea or vomiting. 20 tablet 0   Respiratory Therapy Supplies (NEBULIZER MASK ADULT) MISC Use with nebulized medication q 6 hours prn 1 each 0   Azelastine  HCl 137 MCG/SPRAY SOLN Place 2 sprays into the nose in the morning and at bedtime. 30 mL 5   benzonatate  (TESSALON ) 100 MG capsule Take 1 capsule (100 mg total) by mouth 3 (three) times daily as needed for cough. 30 capsule 0   budesonide -formoterol  (SYMBICORT ) 80-4.5 MCG/ACT inhaler Inhale 2 puffs into the lungs 2 (two) times daily. 1 each 3   fluticasone  (FLOVENT  HFA) 44 MCG/ACT inhaler Inhale 2 puffs into the lungs 2 (two) times daily. 1 each 5   alendronate  (FOSAMAX ) 70 MG tablet Take 1 tablet (70 mg total) by mouth every 7 (seven) days. Take with a full glass of water on an empty stomach and sit upright for 90 minutes after taking (Patient not taking: Reported on 07/24/2024) 12 tablet 4   fluticasone  (FLONASE ) 50 MCG/ACT nasal spray Place 2 sprays into both nostrils daily. 16 g 6   No facility-administered medications prior to visit.    Allergies  Allergen Reactions   Penicillins     ROS    See HPI Objective:    Physical Exam Constitutional:      General: She is not in acute distress.    Appearance: Normal appearance. She is well-developed.  HENT:     Head: Normocephalic and atraumatic.     Right Ear:  Tympanic membrane, ear canal and external ear normal.     Left Ear: Tympanic membrane, ear canal and external ear normal.     Mouth/Throat:     Pharynx: Posterior oropharyngeal erythema present. No pharyngeal swelling or oropharyngeal exudate.  Eyes:     General: No scleral icterus. Neck:     Thyroid : No thyromegaly.  Cardiovascular:  Rate and Rhythm: Normal rate and regular rhythm.     Heart sounds: Normal heart sounds. No murmur heard. Pulmonary:     Effort: Pulmonary effort is normal. No respiratory distress.     Breath sounds: Normal breath sounds. No wheezing.  Musculoskeletal:     Cervical back: Neck supple.  Lymphadenopathy:     Cervical: No cervical adenopathy.  Skin:    General: Skin is warm and dry.  Neurological:     Mental Status: She is alert and oriented to person, place, and time.  Psychiatric:        Mood and Affect: Mood normal.        Behavior: Behavior normal.        Thought Content: Thought content normal.        Judgment: Judgment normal.      BP 132/65 (BP Location: Right Arm, Patient Position: Sitting, Cuff Size: Small)   Pulse 66   Temp 98.2 F (36.8 C) (Oral)   Resp 16   Ht 5' 3 (1.6 m)   Wt 112 lb (50.8 kg)   SpO2 99%   BMI 19.84 kg/m  Wt Readings from Last 3 Encounters:  07/24/24 112 lb (50.8 kg)  03/11/24 111 lb (50.3 kg)  12/24/23 111 lb 3.2 oz (50.4 kg)       Assessment & Plan:   Problem List Items Addressed This Visit       Unprioritized   Strep pharyngitis   Very mildly positive rapid strep test. Rx with azithromycin .  Recommend tylenol/motrin, salt water gargles, chloraseptic spray prn.  Call if symptoms worsen or symptoms fail to improve.       Relevant Medications   azithromycin  (ZITHROMAX ) 250 MG tablet   Post-surgical hypothyroidism   Managed by endocrionology.      Osteoporosis   Maintained on fosamax .      Bone lesion   Incidental finding on hip x-ray. Asymptomatic, monitor.       Asthma   Stable on  symbicort  and prn albuterol . Continue same.       Relevant Medications   budesonide -formoterol  (SYMBICORT ) 80-4.5 MCG/ACT inhaler   Other Visit Diagnoses       Motion sickness, initial encounter    -  Primary   Relevant Medications   scopolamine (TRANSDERM-SCOP) 1 MG/3DAYS     Allergic rhinitis, unspecified seasonality, unspecified trigger       Relevant Medications   Azelastine  HCl 137 MCG/SPRAY SOLN     Screening for colon cancer       Relevant Orders   Cologuard       I have discontinued Emarie Tyndall [name]'s fluticasone , fluticasone , and benzonatate . I am also having her start on scopolamine and azithromycin . Additionally, I am having her maintain her Vitamin D3, levocetirizine, Caltrate 600+D Plus Minerals, alendronate , Nebulizer, Nebulizer Mask Adult, albuterol , ondansetron , montelukast , levothyroxine , budesonide -formoterol , and Azelastine  HCl.  Meds ordered this encounter  Medications   budesonide -formoterol  (SYMBICORT ) 80-4.5 MCG/ACT inhaler    Sig: Inhale 2 puffs into the lungs 2 (two) times daily.    Dispense:  1 each    Refill:  3    Supervising Provider:   DOMENICA BLACKBIRD A [[id number]]   Azelastine  HCl 137 MCG/SPRAY SOLN    Sig: Place 2 sprays into the nose in the morning and at bedtime.    Dispense:  30 mL    Refill:  3    Supervising Provider:   DOMENICA BLACKBIRD A [[id number]]   scopolamine (TRANSDERM-SCOP) 1 MG/3DAYS  Sig: Place 1 patch (1.5 mg total) onto the skin every 3 (three) days.    Dispense:  3 patch    Refill:  0    Supervising Provider:   DOMENICA BLACKBIRD A [[id number]]   azithromycin  (ZITHROMAX ) 250 MG tablet    Sig: Take 2 tablets on day 1, then 1 tablet daily on days 2 through 5    Dispense:  6 tablet    Refill:  0    Supervising Provider:   DOMENICA BLACKBIRD A [[id number]]

## 2024-07-24 NOTE — Assessment & Plan Note (Signed)
Stable on symbicort and prn albuterol. Continue same.

## 2024-08-04 ENCOUNTER — Encounter: Payer: Self-pay | Admitting: Family

## 2024-08-05 ENCOUNTER — Ambulatory Visit: Admitting: Internal Medicine

## 2024-08-05 ENCOUNTER — Encounter: Payer: Self-pay | Admitting: Internal Medicine

## 2024-08-05 VITALS — BP 116/64 | HR 58 | Temp 98.2°F | Resp 16 | Ht 63.0 in | Wt 111.1 lb

## 2024-08-05 DIAGNOSIS — J309 Allergic rhinitis, unspecified: Secondary | ICD-10-CM | POA: Diagnosis not present

## 2024-08-05 DIAGNOSIS — J45909 Unspecified asthma, uncomplicated: Secondary | ICD-10-CM

## 2024-08-05 DIAGNOSIS — J4540 Moderate persistent asthma, uncomplicated: Secondary | ICD-10-CM | POA: Diagnosis not present

## 2024-08-05 MED ORDER — BUDESONIDE-FORMOTEROL FUMARATE 80-4.5 MCG/ACT IN AERO: 2.0000 | INHALATION_SPRAY | Freq: Two times a day (BID) | RESPIRATORY_TRACT | 3 refills | Status: DC

## 2024-08-05 MED ORDER — FLUTICASONE PROPIONATE 50 MCG/ACT NA SUSP
2.0000 | Freq: Every day | NASAL | Status: DC
Start: 2024-08-05 — End: 2024-09-28

## 2024-08-05 MED ORDER — ALBUTEROL SULFATE HFA 108 (90 BASE) MCG/ACT IN AERS: 2.0000 | INHALATION_SPRAY | Freq: Four times a day (QID) | RESPIRATORY_TRACT | 3 refills | Status: DC | PRN

## 2024-08-05 NOTE — Patient Instructions (Addendum)
 Your postnasal dripping is from allergies: -Continue Xyzal once daily for several weeks -Continue the nose spray azelastine : 2 sprays on each side of the nose twice daily - Start another nose spray called Flonase , you can get it over-the-counter: 2 sprays on each side of the nose once daily.  Okay to take Mucinex  as needed for cough  Continue the same medication for asthma  If you are not better next week please let us  know.

## 2024-08-05 NOTE — Progress Notes (Signed)
 Subjective:    Patient ID: Gabrielle Day, female    DOB: Oct 02, 1951, 73 y.o.   MRN: [id number]  DOS:  08/05/2024 Type of visit - description: Acute  07/24/2024: Seen here with sore throat for 3 days, nasal discharge and headaches. On exam, throat was slightly erythematous.  Had a mild positive rapid strep test, Rx Zithromax .  She is here because of the following: Concerned about postnasal dripping, it is persistent. Last week she heard some wheezing, this week is slightly better but would like me to listen to her lungs.  Denies fever or chills. Denies cough.  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   Cholecalciferol (VITAMIN D3) 75 MCG (3000 UT) TABS 1 tablet, Oral, Daily   levocetirizine (XYZAL) 5 mg, Every evening   levothyroxine  (SYNTHROID ) 25 mcg, Oral, Daily before breakfast, Needs appt   montelukast  (SINGULAIR ) 10 mg, Oral, Daily at bedtime   Nebulizer MISC Use with nebulized medication q 6 hours  prn   ondansetron  (ZOFRAN ) 4 mg, Oral, Every 8 hours PRN   Respiratory Therapy Supplies (NEBULIZER MASK ADULT) MISC Use with nebulized medication q 6 hours prn   scopolamine  (TRANSDERM-SCOP) 1.5 mg, Transdermal, every 72 hours       Objective:   Physical Exam BP 116/64   Pulse (!) 58   Temp 98.2 F (36.8 C) (Oral)   Resp 16   Ht 5' 3 (1.6 m)   Wt 111 lb 2 oz (50.4 kg)   SpO2 99%   BMI 19.68 kg/m  General:   Well developed, NAD, BMI noted. HEENT:  Normocephalic . Face symmetric, atraumatic.  TMs normal.  Throat: Symmetric nonrate. Nose slightly congested.  Lungs:  CTA B Normal respiratory effort, no intercostal retractions, no accessory muscle use. Heart: RRR,  no murmur.  Lower extremities: no pretibial edema bilaterally  Skin: Not pale. Not jaundice Neurologic:  alert & oriented X3.  Speech normal, gait appropriate for age and unassisted Psych--  Cognition and judgment appear intact.  Cooperative with normal attention span and concentration.  Behavior appropriate. No anxious or depressed appearing.      Assessment     73 year old female, PMH includes osteoporosis, asthma, migraines, presents with  Allergic rhinitis: Ongoing nasal congestion and postnasal dripping, started Xyzal about a week ago, reports azelastine  2 sprays twice daily.  Symptoms not optimally controlled. Plan: Add Flonase , my assumption is that Xyzal will kick in soon. She will call  next week if not much better, we could prescribe very low-dose of prednisone  (20 mg daily x 5), we could also add Singulair .  Asthma: She was wheezing a little bit last week, it is better in the last couple of days, exam today is benign, continue Symbicort  and albuterol .  Osteoporosis: Patient decided not to take Fosamax , encouraged to discuss with PCP

## 2024-08-08 LAB — COLOGUARD: COLOGUARD: NEGATIVE

## 2024-08-10 ENCOUNTER — Ambulatory Visit: Payer: Self-pay | Admitting: Family

## 2024-08-10 ENCOUNTER — Telehealth: Payer: Self-pay | Admitting: Family

## 2024-08-10 NOTE — Telephone Encounter (Signed)
 Please request mammo from [facility].

## 2024-08-10 NOTE — Telephone Encounter (Signed)
 Electronic request sent

## 2024-08-31 ENCOUNTER — Encounter: Payer: Federal, State, Local not specified - PPO | Admitting: Family

## 2024-09-02 ENCOUNTER — Encounter: Payer: Federal, State, Local not specified - PPO | Admitting: Family

## 2024-09-08 ENCOUNTER — Other Ambulatory Visit: Payer: Self-pay | Admitting: Family

## 2024-09-08 DIAGNOSIS — E042 Nontoxic multinodular goiter: Secondary | ICD-10-CM

## 2024-09-12 ENCOUNTER — Other Ambulatory Visit: Payer: Self-pay | Admitting: Family

## 2024-09-12 DIAGNOSIS — E042 Nontoxic multinodular goiter: Secondary | ICD-10-CM

## 2024-09-15 ENCOUNTER — Encounter: Admitting: Family Medicine

## 2024-09-16 ENCOUNTER — Ambulatory Visit: Admitting: Family

## 2024-09-21 ENCOUNTER — Telehealth: Payer: Self-pay | Admitting: Family

## 2024-09-21 NOTE — Telephone Encounter (Signed)
 Pts husband came in needing a refill on symbicort  and ventolin  HFA. Please call pt when it has been sent in and call pt for any questions.

## 2024-09-21 NOTE — Telephone Encounter (Signed)
 No call back number recorded for us  to call back.  Called number listed a few times but no answer. Lvm with detail information that the prescriptions were sent to [facility] in August with 3 refills each.

## 2024-09-23 ENCOUNTER — Other Ambulatory Visit: Payer: Self-pay

## 2024-09-23 DIAGNOSIS — J45909 Unspecified asthma, uncomplicated: Secondary | ICD-10-CM

## 2024-09-23 DIAGNOSIS — J4541 Moderate persistent asthma with (acute) exacerbation: Secondary | ICD-10-CM

## 2024-09-23 DIAGNOSIS — J4540 Moderate persistent asthma, uncomplicated: Secondary | ICD-10-CM

## 2024-09-23 MED ORDER — BUDESONIDE-FORMOTEROL FUMARATE 80-4.5 MCG/ACT IN AERO: 2.0000 | INHALATION_SPRAY | Freq: Two times a day (BID) | RESPIRATORY_TRACT | 3 refills | Status: DC

## 2024-09-23 MED ORDER — ALBUTEROL SULFATE HFA 108 (90 BASE) MCG/ACT IN AERS: 2.0000 | INHALATION_SPRAY | Freq: Four times a day (QID) | RESPIRATORY_TRACT | 3 refills | Status: DC | PRN

## 2024-09-23 NOTE — Telephone Encounter (Signed)
 Contacted patient and she reports pharmacy told her she needed a new rx for simbicort and ventolin . Both sent today

## 2024-09-28 ENCOUNTER — Ambulatory Visit: Admitting: Family Medicine

## 2024-09-28 ENCOUNTER — Encounter: Payer: Self-pay | Admitting: Family Medicine

## 2024-09-28 ENCOUNTER — Ambulatory Visit (HOSPITAL_BASED_OUTPATIENT_CLINIC_OR_DEPARTMENT_OTHER)
Admission: RE | Admit: 2024-09-28 | Discharge: 2024-09-28 | Disposition: A | Discharge status: Home / Self Care | Source: Ambulatory Visit | Attending: Family Medicine | Admitting: Family Medicine

## 2024-09-28 ENCOUNTER — Ambulatory Visit: Payer: Self-pay | Admitting: Family Medicine

## 2024-09-28 VITALS — BP 98/70 | HR 66 | Temp 97.6°F | Resp 16 | Ht 63.0 in | Wt 113.6 lb

## 2024-09-28 DIAGNOSIS — J45909 Unspecified asthma, uncomplicated: Secondary | ICD-10-CM | POA: Diagnosis not present

## 2024-09-28 DIAGNOSIS — R051 Acute cough: Secondary | ICD-10-CM | POA: Insufficient documentation

## 2024-09-28 DIAGNOSIS — J4540 Moderate persistent asthma, uncomplicated: Secondary | ICD-10-CM

## 2024-09-28 DIAGNOSIS — J029 Acute pharyngitis, unspecified: Secondary | ICD-10-CM | POA: Diagnosis not present

## 2024-09-28 DIAGNOSIS — J40 Bronchitis, not specified as acute or chronic: Secondary | ICD-10-CM

## 2024-09-28 LAB — POC COVID19 BINAXNOW: SARS Coronavirus 2 Ag: NEGATIVE

## 2024-09-28 LAB — POCT RAPID STREP A (OFFICE): Rapid Strep A Screen: NEGATIVE

## 2024-09-28 MED ORDER — PROMETHAZINE-DM 6.25-15 MG/5ML PO SYRP: 5.0000 mL | ORAL_SOLUTION | Freq: Four times a day (QID) | ORAL | 0 refills | Status: DC | PRN

## 2024-09-28 MED ORDER — ALBUTEROL SULFATE HFA 108 (90 BASE) MCG/ACT IN AERS: 2.0000 | INHALATION_SPRAY | Freq: Four times a day (QID) | RESPIRATORY_TRACT | 3 refills | Status: AC | PRN

## 2024-09-28 MED ORDER — FLUTICASONE PROPIONATE 50 MCG/ACT NA SUSP: 2.0000 | Freq: Every day | NASAL | 5 refills | Status: AC

## 2024-09-28 MED ORDER — AZITHROMYCIN 250 MG PO TABS: ORAL_TABLET | ORAL | 0 refills | Status: DC

## 2024-09-28 MED ORDER — BUDESONIDE-FORMOTEROL FUMARATE 80-4.5 MCG/ACT IN AERO
2.0000 | INHALATION_SPRAY | Freq: Two times a day (BID) | RESPIRATORY_TRACT | 3 refills | Status: AC
Start: 2024-09-28 — End: ?

## 2024-09-28 MED ORDER — METHYLPREDNISOLONE ACETATE 80 MG/ML IJ SUSP
80.0000 mg | Freq: Once | INTRAMUSCULAR | Status: AC
  Administered 2024-09-28: 80 mg via INTRAMUSCULAR

## 2024-09-28 MED ORDER — PREDNISONE 10 MG PO TABS: ORAL_TABLET | ORAL | 0 refills | Status: DC

## 2024-09-28 NOTE — Patient Instructions (Signed)
 Start the prednisone  tomorrow  Follow up with [facility] in 2 weeks or sooner if you are not getting better     Acute Bronchitis, Adult  Acute bronchitis is sudden inflammation of the main airways (bronchi) that come off the windpipe (trachea) in the lungs. The swelling causes the airways to get smaller and make more mucus than normal. This can make it hard to breathe and can cause coughing or noisy breathing (wheezing). Acute bronchitis may last several weeks. The cough may last longer. Allergies, asthma, and exposure to smoke may make the condition worse. What are the causes? This condition can be caused by germs and by substances that irritate the lungs, including: Cold and flu viruses. The most common cause of this condition is the virus that causes the common cold. Bacteria. This is less common. Breathing in substances that irritate the lungs, including: Smoke from cigarettes and other forms of tobacco. Dust and pollen. Fumes from household cleaning products, gases, or burned fuel. Indoor or outdoor air pollution. What increases the risk? The following factors may make you more likely to develop this condition: A weak body's defense system, also called the immune system. A condition that affects your lungs and breathing, such as asthma. What are the signs or symptoms? Common symptoms of this condition include: Coughing. This may bring up clear, yellow, or green mucus from your lungs (sputum). Wheezing. Runny or stuffy nose. Having too much mucus in your lungs (chest congestion). Shortness of breath. Aches and pains, including sore throat or chest. How is this diagnosed? This condition is usually diagnosed based on: Your symptoms and medical history. A physical exam. You may also have other tests, including tests to rule out other conditions, such as pneumonia. These tests include: A test of lung function. Test of a mucus sample to look for the presence of bacteria. Tests to  check the oxygen level in your blood. Blood tests. Chest X-ray. How is this treated? Most cases of acute bronchitis clear up over time without treatment. Your health care provider may recommend: Drinking more fluids to help thin your mucus so it is easier to cough up. Taking inhaled medicine (inhaler) to improve air flow in and out of your lungs. Using a vaporizer or a humidifier. These are machines that add water to the air to help you breathe better. Taking a medicine that thins mucus and clears congestion (expectorant). Taking a medicine that prevents or stops coughing (cough suppressant). It is not common to take an antibiotic medicine for this condition. Follow these instructions at home:  Take over-the-counter and prescription medicines only as told by your health care provider. Use an inhaler, vaporizer, or humidifier as told by your health care provider. Take two teaspoons (10 mL) of honey at bedtime to lessen coughing at night. Drink enough fluid to keep your urine pale yellow. Do not use any products that contain nicotine or tobacco. These products include cigarettes, chewing tobacco, and vaping devices, such as e-cigarettes. If you need help quitting, ask your health care provider. Get plenty of rest. Return to your normal activities as told by your health care provider. Ask your health care provider what activities are safe for you. Keep all follow-up visits. This is important. How is this prevented? To lower your risk of getting this condition again: Wash your hands often with soap and water for at least 20 seconds. If soap and water are not available, use hand sanitizer. Avoid contact with people who have cold symptoms. Try not  to touch your mouth, nose, or eyes with your hands. Avoid breathing in smoke or chemical fumes. Breathing smoke or chemical fumes will make your condition worse. Get the flu shot every year. Contact a health care provider if: Your symptoms do not  improve after 2 weeks. You have trouble coughing up the mucus. Your cough keeps you awake at night. You have a fever. Get help right away if you: Cough up blood. Feel pain in your chest. Have severe shortness of breath. Faint or keep feeling like you are going to faint. Have a severe headache. Have a fever or chills that get worse. These symptoms may represent a serious problem that is an emergency. Do not wait to see if the symptoms will go away. Get medical help right away. Call your local emergency services (911 in the [place] Do not drive yourself to the hospital. Summary Acute bronchitis is inflammation of the main airways (bronchi) that come off the windpipe (trachea) in the lungs. The swelling causes the airways to get smaller and make more mucus than normal. Drinking more fluids can help thin your mucus so it is easier to cough up. Take over-the-counter and prescription medicines only as told by your health care provider. Do not use any products that contain nicotine or tobacco. These products include cigarettes, chewing tobacco, and vaping devices, such as e-cigarettes. If you need help quitting, ask your health care provider. Contact a health care provider if your symptoms do not improve after 2 weeks. This information is not intended to replace advice given to you by your health care provider. Make sure you discuss any questions you have with your health care provider. Document Revised: 03/22/2022 Document Reviewed: 04/12/2021 Elsevier Patient Education  2024 [employer].

## 2024-09-28 NOTE — Progress Notes (Signed)
 Subjective:    Patient ID: Gabrielle Day, female    DOB: October 18, 1951, 73 y.o.   MRN: [id number]  Chief Complaint  Patient presents with   Cough    Pt states having pro cough, congestion, sore throat, x5 days, Negative COVID on Friday. Pt states having asthma and not sleeping good. Pt states taking Delsym and xyzal.     HPI Patient is in today for cough, sore throat.  Discussed the use of AI scribe software for clinical note transcription with the patient, who gave verbal consent to proceed.  History of Present Illness Gabrielle Day is a 73 year old female with asthma who presents with a sore throat, runny nose, and cough.  She has been experiencing symptoms for five days, including a sore throat, runny nose, and a significant cough producing yellow mucus. Her throat remains dry, contributing to the soreness. No fever. A COVID test conducted on Friday was negative.  She has a history of asthma and is experiencing difficulty breathing, which she attributes to her current symptoms. She is using Symbicort  and albuterol  as part of her asthma management. She also has a nebulizer at home but prefers using Symbicort , which she finds helpful.  She is currently taking Delsym and Xyzal for her symptoms. The cough is affecting her sleep, as she has been unable to sleep for two nights due to the coughing. She is concerned about her blood pressure being lower than normal, around 110, but does not provide further details.  She experiences some chest tightness and a little sinus pressure. She also mentions occasional back pain, which she associates with her current respiratory symptoms.    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    Family History  Problem Relation Age of Onset   Alzheimer's disease Father    Stroke Brother    Asthma Son     Social History   Socioeconomic History   Marital status: Divorced    Spouse name: Visual merchandiser   Number of children: 2   Years of education: Not on file   Highest education level: Not on file  Occupational History   Not on file  Tobacco Use   Smoking status: Never   Smokeless tobacco: Never  Vaping Use   Vaping status: Never Used  Substance and Sexual Activity   Alcohol use: Never   Drug use: Never   Sexual activity: Yes    Partners: Male  Other Topics Concern   Not on file  Social History Narrative   Married   2 grown children, 4 grandchildren (live in [place])   Homemaker   Enjoys reading   [place]rew up in [place], moved [place] age 68   Bachelor degree in history   Social Drivers of Corporate investment banker Strain: Not on file  Food Insecurity: Low Risk  (08/27/2023)   Received from [facility]   Hunger Vital Sign    Within the past 12 months, you worried that your food would run out before you got money to  buy more: Never true    Within the past 12 months, the food you bought just didn't last and you didn't have money to get more. : Never true  Transportation Needs: No Transportation Needs (08/27/2023)   Received from [facility]    In the past 12 months, has lack of reliable transportation kept you from medical appointments, meetings, work or from getting things needed for daily living? : No  Physical Activity: Not on file  Stress: Not on file  Social Connections: Unknown (08/10/2023)   Received from [facility]   Social Network    Social Network: Not on file  Intimate Partner Violence: Unknown (08/10/2023)   Received from [facility]   HITS    Physically Hurt: Not on file    Insult or Talk  Down To: Not on file    Threaten Physical Harm: Not on file    Scream or Curse: Not on file    Outpatient Medications Prior to Visit  Medication Sig Dispense Refill   Azelastine  HCl 137 MCG/SPRAY SOLN Place 2 sprays into the nose in the morning and at bedtime. 30 mL 3   Calcium Carbonate-Vit D-Min (CALTRATE 600+D PLUS MINERALS) 600-800 MG-UNIT CHEW One chew twice daily. 60 tablet 0   Cholecalciferol (VITAMIN D3) 75 MCG (3000 UT) TABS Take 1 tablet by mouth daily at 6 (six) AM. 30 tablet    levocetirizine (XYZAL) 5 MG tablet Take 5 mg by mouth every evening.     levothyroxine  (SYNTHROID ) 25 MCG tablet TAKE 1 TABLET BY MOUTH DAILY BEFORE BREAKFAST. NEEDS APPT 30 tablet 0   montelukast  (SINGULAIR ) 10 MG tablet Take 1 tablet (10 mg total) by mouth at bedtime. 90 tablet 1   Nebulizer MISC Use with nebulized medication q 6 hours prn 1 each 0   Respiratory Therapy Supplies (NEBULIZER MASK ADULT) MISC Use with nebulized medication q 6 hours prn 1 each 0   scopolamine  (TRANSDERM-SCOP) 1 MG/3DAYS Place 1 patch (1.5 mg total) onto the skin every 3 (three) days. (Patient not taking: Reported on 08/05/2024) 3 patch 0   albuterol  (VENTOLIN  HFA) 108 (90 Base) MCG/ACT inhaler Inhale 2 puffs into the lungs every 6 (six) hours as needed for wheezing or shortness of breath. 18 g 3   alendronate  (FOSAMAX ) 70 MG tablet Take 1 tablet (70 mg total) by mouth every 7 (seven) days. Take with a full glass of water on an empty stomach and sit upright for 90 minutes after taking (Patient not taking: Reported on 09/28/2024) 12 tablet 4   budesonide -formoterol  (SYMBICORT ) 80-4.5 MCG/ACT inhaler Inhale 2 puffs into the lungs 2 (two) times daily. 10.2 g 3   fluticasone  (FLONASE ) 50 MCG/ACT nasal spray Place 2 sprays into both nostrils daily.     ondansetron  (ZOFRAN ) 4 MG tablet Take 1 tablet (4 mg total) by mouth every 8 (eight) hours as needed for nausea or vomiting. (Patient not taking: Reported on 08/05/2024) 20 tablet 0   No  facility-administered medications prior to visit.    Allergies  Allergen Reactions   Penicillins     Review of Systems  Constitutional:  Negative for fever and malaise/fatigue.  HENT:  Positive for congestion and sore throat. Negative for sinus pain.   Eyes:  Negative for blurred vision.  Respiratory:  Positive for cough, sputum production and wheezing. Negative for shortness of breath.   Cardiovascular:  Negative for chest pain, palpitations and leg swelling.  Gastrointestinal:  Negative for vomiting.  Musculoskeletal:  Negative  for back pain.  Skin:  Negative for rash.  Neurological:  Negative for loss of consciousness and headaches.       Objective:    Physical Exam Vitals and nursing note reviewed.  HENT:     Right Ear: Tympanic membrane and ear canal normal.     Left Ear: Tympanic membrane and ear canal normal.     Nose: Nose normal.  Pulmonary:     Effort: No respiratory distress.     Breath sounds: Examination of the left-lower field reveals rales. Wheezing and rales present.     BP 98/70 (BP Location: Right Arm, Patient Position: Sitting, Cuff Size: Normal)   Pulse 66   Temp 97.6 F (36.4 C) (Oral)   Resp 16   Ht 5' 3 (1.6 m)   Wt 113 lb 9.6 oz (51.5 kg)   SpO2 98%   BMI 20.12 kg/m  Wt Readings from Last 3 Encounters:  09/28/24 113 lb 9.6 oz (51.5 kg)  08/05/24 111 lb 2 oz (50.4 kg)  07/24/24 112 lb (50.8 kg)    Diabetic Foot Exam - Simple   No data filed    Lab Results  Component Value Date   WBC 4.1 08/28/2023   HGB 12.3 08/28/2023   HCT 39.0 08/28/2023   PLT 213.0 08/28/2023   GLUCOSE 84 08/14/2023   CHOL 229 (H) 08/28/2023   TRIG 58.0 08/28/2023   HDL 75.20 08/28/2023   LDLDIRECT 137.0 08/14/2023   LDLCALC 143 (H) 08/28/2023   ALT 14 11/21/2022   AST 18 11/21/2022   NA 138 08/14/2023   K 4.6 08/14/2023   CL 100 08/14/2023   CREATININE 0.71 08/14/2023   BUN 8 08/14/2023   CO2 29 08/14/2023   TSH 1.92 08/14/2023    Lab Results   Component Value Date   TSH 1.92 08/14/2023   Lab Results  Component Value Date   WBC 4.1 08/28/2023   HGB 12.3 08/28/2023   HCT 39.0 08/28/2023   MCV 89.8 08/28/2023   PLT 213.0 08/28/2023   Lab Results  Component Value Date   NA 138 08/14/2023   K 4.6 08/14/2023   CO2 29 08/14/2023   GLUCOSE 84 08/14/2023   BUN 8 08/14/2023   CREATININE 0.71 08/14/2023   BILITOT 0.4 11/21/2022   ALKPHOS 75 11/21/2022   AST 18 11/21/2022   ALT 14 11/21/2022   PROT 7.4 11/21/2022   ALBUMIN 4.6 11/21/2022   CALCIUM 9.0 08/14/2023   GFR 86.34 08/14/2023   Lab Results  Component Value Date   CHOL 229 (H) 08/28/2023   Lab Results  Component Value Date   HDL 75.20 08/28/2023   Lab Results  Component Value Date   LDLCALC 143 (H) 08/28/2023   Lab Results  Component Value Date   TRIG 58.0 08/28/2023   Lab Results  Component Value Date   CHOLHDL 3 08/28/2023   No results found for: HGBA1C     Assessment & Plan:  Bronchitis -     Azithromycin ; As directed  Dispense: 6 each; Refill: 0 -     Promethazine -DM; Take 5 mLs by mouth 4 (four) times daily as needed.  Dispense: 118 mL; Refill: 0 -     predniSONE ; TAKE 3 TABLETS PO QD FOR 3 DAYS THEN TAKE 2 TABLETS PO QD FOR 3 DAYS THEN TAKE 1 TABLET PO QD FOR 3 DAYS THEN TAKE 1/2 TAB PO QD FOR 3 DAYS  Dispense: 20 tablet; Refill: 0 -     methylPREDNISolone   Acetate  Acute cough -     DG Chest 2 View; Future -     POC COVID-19 BinaxNow  Sore throat -     POCT rapid strep A  Uncomplicated asthma, unspecified asthma severity, unspecified whether persistent -     Albuterol  Sulfate HFA; Inhale 2 puffs into the lungs every 6 (six) hours as needed for wheezing or shortness of breath.  Dispense: 18 g; Refill: 3  Moderate persistent asthma without complication -     Budesonide -Formoterol  Fumarate; Inhale 2 puffs into the lungs 2 (two) times daily.  Dispense: 10.2 g; Refill: 3 -     methylPREDNISolone  Acetate  Other orders -      Fluticasone  Propionate; Place 2 sprays into both nostrils daily.  Dispense: 16 g; Refill: 5  Assessment and Plan Assessment & Plan Asthma with acute exacerbation   She experiences an asthma exacerbation with difficulty breathing, chest tightness, and coughing, leading to throat soreness and sleep disturbance. No fever is present, and the COVID test is negative. She uses Symbicort  and albuterol  but not a nebulizer. Administer a prednisone  injection today for rapid relief, with effects expected in 6-8 hours. Prescribe prednisone  pills to start tomorrow, along with albuterol  and Symbicort  inhalers.  Possible pneumonia   Possible pneumonia is suspected due to back pain and abnormal lung sounds. Order a chest x-ray to evaluate for pneumonia.  Acute upper respiratory infection   She has an acute upper respiratory infection with a sore throat, runny nose, and cough producing yellow mucus, which began five days ago. The strep throat test is negative, and throat soreness is likely due to coughing. Prescribe fluticasone  nasal spray for nasal symptoms and cough medicine for nighttime use to aid sleep. Advise using Delsym or Mucinex  during the day.    Keneisha Heckart R Lowne Chase, DO

## 2024-10-13 ENCOUNTER — Encounter: Payer: Self-pay | Admitting: Family Medicine

## 2024-10-13 ENCOUNTER — Ambulatory Visit: Admitting: Family Medicine

## 2024-10-13 VITALS — BP 118/70 | HR 67 | Temp 97.8°F | Resp 16 | Ht 63.0 in | Wt 112.0 lb

## 2024-10-13 DIAGNOSIS — J45909 Unspecified asthma, uncomplicated: Secondary | ICD-10-CM

## 2024-10-13 DIAGNOSIS — R300 Dysuria: Secondary | ICD-10-CM

## 2024-10-13 LAB — POC URINALSYSI DIPSTICK (AUTOMATED)
Bilirubin, UA: NEGATIVE
Blood, UA: NEGATIVE
Glucose, UA: NEGATIVE
Ketones, UA: NEGATIVE
Leukocytes, UA: NEGATIVE
Nitrite, UA: NEGATIVE
Protein, UA: NEGATIVE
Spec Grav, UA: <=1.005 — AB (ref 1.010–1.025)
Urobilinogen, UA: 0.2 U/dL
pH, UA: 7 (ref 5.0–8.0)

## 2024-10-13 NOTE — Progress Notes (Signed)
 Subjective:    Patient ID: Gabrielle Day, female    DOB: Oct 14, 1951, 73 y.o.   MRN: [id number]  Chief Complaint  Patient presents with   Urinary Frequency    X2-3 weeks, some burning and freq.     HPI Patient is in today for dysuria and frequency.    Discussed the use of AI scribe software for clinical note transcription with the patient, who gave verbal consent to proceed.  History of Present Illness Gabrielle Day is a 73 year old female who presents with urinary discomfort and burning sensation.  She experiences a mild burning sensation during urination, described as 'not normal' and 'pressure'. No hematuria or urinary incontinence. She typically wakes up once at night to urinate, which she considers normal, but now feels a burning sensation. She has a history of urinary infections, with the last occurrence being a long time ago, treated with antibiotics.  She inquires about her Symbicort  prescription, which she uses to control her asthma. The prescription was sent to her pharmacy on September 28, 2024, but she has not yet checked with the pharmacy as she still has some medication left.    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    Family History  Problem Relation Age of Onset   Alzheimer's disease Father    Stroke Brother    Asthma Son     Social History   Socioeconomic History   Marital status: Divorced    Spouse name: Visual merchandiser   Number of children: 2   Years of education: Not on file   Highest education level: Not on file  Occupational History   Not on file   Tobacco Use   Smoking status: Never   Smokeless tobacco: Never  Vaping Use   Vaping status: Never Used  Substance and Sexual Activity   Alcohol use: Never   Drug use: Never   Sexual activity: Yes    Partners: Male  Other Topics Concern   Not on file  Social History Narrative   Married   2 grown children, 4 grandchildren (live in [place])   Futures trader   Enjoys reading   [place]rew up in [place], moved [place] 23   Bachelor degree in history   Social Drivers of Corporate investment banker Strain: Not on file  Food Insecurity: Low Risk  (09/29/2024)   Received from [facility]   Hunger Vital Sign    Within the past 12 months, you worried that your food would run out before you got money to buy more: Never true    Within the past 12 months, the food you bought just didn't last and you didn't have money to get more. : Never true  Transportation Needs: No Transportation Needs (09/29/2024)   Received from [facility]    In the past 12 months, has lack of reliable transportation kept you from medical appointments, meetings, work or from getting things needed for daily living? : No  Physical Activity: Not on file  Stress: Not  on file  Social Connections: Unknown (08/10/2023)   Received from [facility]   Social Network    Social Network: Not on file  Intimate Partner Violence: Unknown (08/10/2023)   Received from [facility]   HITS    Physically Hurt: Not on file    Insult or Talk Down To: Not on file    Threaten Physical Harm: Not on file    Scream or Curse: Not on file    Outpatient Medications Prior to Visit  Medication Sig Dispense Refill   albuterol  (VENTOLIN  HFA) 108 (90 Base) MCG/ACT inhaler Inhale 2 puffs into the lungs every 6 (six) hours as needed for wheezing or shortness of breath. 18 g 3   Azelastine  HCl 137 MCG/SPRAY SOLN Place 2 sprays into the nose in the morning and at bedtime. 30 mL 3   budesonide -formoterol  (SYMBICORT ) 80-4.5 MCG/ACT  inhaler Inhale 2 puffs into the lungs 2 (two) times daily. 10.2 g 3   Calcium Carbonate-Vit D-Min (CALTRATE 600+D PLUS MINERALS) 600-800 MG-UNIT CHEW One chew twice daily. 60 tablet 0   Cholecalciferol (VITAMIN D3) 75 MCG (3000 UT) TABS Take 1 tablet by mouth daily at 6 (six) AM. 30 tablet    fluticasone  (FLONASE ) 50 MCG/ACT nasal spray Place 2 sprays into both nostrils daily. 16 g 5   levocetirizine (XYZAL) 5 MG tablet Take 5 mg by mouth every evening.     levothyroxine  (SYNTHROID ) 25 MCG tablet TAKE 1 TABLET BY MOUTH DAILY BEFORE BREAKFAST. NEEDS APPT 30 tablet 0   montelukast  (SINGULAIR ) 10 MG tablet Take 1 tablet (10 mg total) by mouth at bedtime. 90 tablet 1   Nebulizer MISC Use with nebulized medication q 6 hours prn 1 each 0   Respiratory Therapy Supplies (NEBULIZER MASK ADULT) MISC Use with nebulized medication q 6 hours prn 1 each 0   scopolamine  (TRANSDERM-SCOP) 1 MG/3DAYS Place 1 patch (1.5 mg total) onto the skin every 3 (three) days. 3 patch 0   azithromycin  (ZITHROMAX  Z-PAK) 250 MG tablet As directed 6 each 0   predniSONE  (DELTASONE ) 10 MG tablet TAKE 3 TABLETS PO QD FOR 3 DAYS THEN TAKE 2 TABLETS PO QD FOR 3 DAYS THEN TAKE 1 TABLET PO QD FOR 3 DAYS THEN TAKE 1/2 TAB PO QD FOR 3 DAYS 20 tablet 0   promethazine -dextromethorphan (PROMETHAZINE -DM) 6.25-15 MG/5ML syrup Take 5 mLs by mouth 4 (four) times daily as needed. 118 mL 0   No facility-administered medications prior to visit.    Allergies  Allergen Reactions   Penicillins     Review of Systems  Constitutional:  Negative for fever and malaise/fatigue.  HENT:  Negative for congestion.   Eyes:  Negative for blurred vision.  Respiratory:  Negative for cough and shortness of breath.   Cardiovascular:  Negative for chest pain, palpitations and leg swelling.  Gastrointestinal:  Negative for abdominal pain, blood in stool, nausea and vomiting.  Genitourinary:  Negative for dysuria and frequency.  Musculoskeletal:  Negative for  back pain and falls.  Skin:  Negative for rash.  Neurological:  Negative for dizziness, loss of consciousness and headaches.  Endo/Heme/Allergies:  Negative for environmental allergies.  Psychiatric/Behavioral:  Negative for depression. The patient is not nervous/anxious.        Objective:    Physical Exam Vitals and nursing note reviewed.  Constitutional:      General: She is not in acute distress.    Appearance: Normal appearance. She is well-developed.  HENT:     Head: Normocephalic  and atraumatic.  Eyes:     General: No scleral icterus.       Right eye: No discharge.        Left eye: No discharge.  Cardiovascular:     Rate and Rhythm: Normal rate and regular rhythm.     Heart sounds: No murmur heard. Pulmonary:     Effort: Pulmonary effort is normal. No respiratory distress.     Breath sounds: Normal breath sounds.  Musculoskeletal:        General: Normal range of motion.     Cervical back: Normal range of motion and neck supple.     Right lower leg: No edema.     Left lower leg: No edema.  Skin:    General: Skin is warm and dry.  Neurological:     Mental Status: She is alert and oriented to person, place, and time.  Psychiatric:        Mood and Affect: Mood normal.        Behavior: Behavior normal.        Thought Content: Thought content normal.        Judgment: Judgment normal.     BP 118/70 (BP Location: Left Arm, Patient Position: Sitting, Cuff Size: Small)   Pulse 67   Temp 97.8 F (36.6 C) (Oral)   Resp 16   Ht 5' 3 (1.6 m)   Wt 112 lb (50.8 kg)   SpO2 99%   BMI 19.84 kg/m  Wt Readings from Last 3 Encounters:  10/13/24 112 lb (50.8 kg)  09/28/24 113 lb 9.6 oz (51.5 kg)  08/05/24 111 lb 2 oz (50.4 kg)    Diabetic Foot Exam - Simple   No data filed    Lab Results  Component Value Date   WBC 4.1 08/28/2023   HGB 12.3 08/28/2023   HCT 39.0 08/28/2023   PLT 213.0 08/28/2023   GLUCOSE 84 08/14/2023   CHOL 229 (H) 08/28/2023   TRIG 58.0  08/28/2023   HDL 75.20 08/28/2023   LDLDIRECT 137.0 08/14/2023   LDLCALC 143 (H) 08/28/2023   ALT 14 11/21/2022   AST 18 11/21/2022   NA 138 08/14/2023   K 4.6 08/14/2023   CL 100 08/14/2023   CREATININE 0.71 08/14/2023   BUN 8 08/14/2023   CO2 29 08/14/2023   TSH 1.92 08/14/2023    Lab Results  Component Value Date   TSH 1.92 08/14/2023   Lab Results  Component Value Date   WBC 4.1 08/28/2023   HGB 12.3 08/28/2023   HCT 39.0 08/28/2023   MCV 89.8 08/28/2023   PLT 213.0 08/28/2023   Lab Results  Component Value Date   NA 138 08/14/2023   K 4.6 08/14/2023   CO2 29 08/14/2023   GLUCOSE 84 08/14/2023   BUN 8 08/14/2023   CREATININE 0.71 08/14/2023   BILITOT 0.4 11/21/2022   ALKPHOS 75 11/21/2022   AST 18 11/21/2022   ALT 14 11/21/2022   PROT 7.4 11/21/2022   ALBUMIN 4.6 11/21/2022   CALCIUM 9.0 08/14/2023   GFR 86.34 08/14/2023   Lab Results  Component Value Date   CHOL 229 (H) 08/28/2023   Lab Results  Component Value Date   HDL 75.20 08/28/2023   Lab Results  Component Value Date   LDLCALC 143 (H) 08/28/2023   Lab Results  Component Value Date   TRIG 58.0 08/28/2023   Lab Results  Component Value Date   CHOLHDL 3 08/28/2023   No results found for:  HGBA1C     Assessment & Plan:  Dysuria -     POCT Urinalysis Dipstick (Automated) -     Urine Culture  Uncomplicated asthma, unspecified asthma severity, unspecified whether persistent   Assessment and Plan Assessment & Plan Dysuria   She experiences mild dysuria with a sensation of pressure, but no hematuria or urinary incontinence. Urinalysis shows no infection, though a urine culture is pending to rule out bacterial infection. Symptoms are mild without significant burning. Send urine for culture to confirm absence of infection. Advise increasing water intake. Instruct her to call the office on Thursday or Friday if symptoms persist or worsen, as culture results should be available by  then.  Asthma   Her asthma is currently controlled with Symbicort . A prescription for Symbicort  was sent on September 28, 2024, but she has not checked with the pharmacy as she still has medication remaining. Verify Symbicort  prescription at the pharmacy and continue current asthma management with Symbicort .   Shawnae Leiva R Lowne Chase, DO

## 2024-10-14 LAB — URINE CULTURE
MICRO NUMBER:: 17127392
Result:: NO GROWTH
SPECIMEN QUALITY:: ADEQUATE

## 2024-10-17 ENCOUNTER — Ambulatory Visit: Payer: Self-pay | Admitting: Family

## 2024-10-18 ENCOUNTER — Other Ambulatory Visit: Payer: Self-pay | Admitting: Family Medicine

## 2024-10-18 DIAGNOSIS — R300 Dysuria: Secondary | ICD-10-CM

## 2025-01-12 NOTE — Progress Notes (Unsigned)
" ° ° °  Chief Complaint: Dysuria  History of Present Illness: 74 year old female sent by Dr. Antonio Meth for evaluation and management of dysuria.   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    Allergies:  Allergies[1]  Family History:  Family History  Problem Relation Age of Onset   Alzheimer's disease Father    Stroke Brother    Asthma Son     Social History:  Social History[2]  Review of symptoms:  Constitutional:  Negative for unexplained weight loss, night sweats, fever, chills ENT:  Negative for nose bleeds, sinus pain, painful swallowing CV:  Negative for chest pain, shortness of breath, exercise intolerance, palpitations, loss of consciousness Resp:  Negative for cough, wheezing, shortness of breath GI:  Negative for nausea, vomiting, diarrhea, bloody stools GU:  Positives noted in HPI; otherwise negative for gross hematuria, dysuria, urinary incontinence Neuro:  Negative for seizures, poor balance, limb weakness, slurred speech Psych:  Negative for lack of energy, depression, anxiety Endocrine:  Negative for polydipsia, polyuria, symptoms of hypoglycemia (dizziness, hunger, sweating) Hematologic:  Negative for anemia, purpura, petechia, prolonged or excessive bleeding, use of anticoagulants  Allergic:  Negative for difficulty breathing or choking as a result of exposure to anything; no shellfish allergy; no allergic response (rash/itch) to materials, foods  Physical exam: There were no vitals taken for this visit. GENERAL APPEARANCE:   Well appearing, well developed, well nourished, NAD HEENT: Atraumatic, Normocephalic. NECK: Normal appearance LUNGS: Normal inspiratory and expiratory excursion HEART: Regular Rate ABDOMEN: *** EXTREMITIES: Moves all extremities well.  Without clubbing, cyanosis, or edema. NEUROLOGIC:  Alert and oriented x 3, normal gait, CN II-XII grossly intact.  MENTAL STATUS:  Appropriate. SKIN:  Warm, dry and intact.    Results: No results found for this or any previous visit (from the past 24 hours).  I have reviewed prior patient's records  I have reviewed referring/prior physicians records  I have reviewed urinalysis  I have reviewed prior urine cultures  I reviewed prior imaging studies  Assessment: No diagnosis found.   Plan: ***     [1]  Allergies Allergen Reactions   Penicillins   [2]  Social History Tobacco Use   Smoking status: Never   Smokeless tobacco: Never  Vaping Use   Vaping status: Never Used  Substance Use Topics   Alcohol use: Never   Drug use: Never   "

## 2025-01-13 ENCOUNTER — Encounter: Payer: Self-pay | Admitting: Urology

## 2025-01-13 ENCOUNTER — Ambulatory Visit: Admitting: Urology

## 2025-01-13 VITALS — BP 123/71 | HR 73 | Ht 63.0 in | Wt 112.0 lb

## 2025-01-13 DIAGNOSIS — R351 Nocturia: Secondary | ICD-10-CM

## 2025-01-13 DIAGNOSIS — R300 Dysuria: Secondary | ICD-10-CM

## 2025-01-13 LAB — MICROSCOPIC EXAMINATION

## 2025-01-13 LAB — URINALYSIS, ROUTINE W REFLEX MICROSCOPIC
Bilirubin, UA: NEGATIVE
Glucose, UA: NEGATIVE
Ketones, UA: NEGATIVE
Leukocytes,UA: NEGATIVE
Nitrite, UA: NEGATIVE
Protein,UA: NEGATIVE
Specific Gravity, UA: 1.02 (ref 1.005–1.030)
Urobilinogen, Ur: 0.2 mg/dL (ref 0.2–1.0)
pH, UA: 5.5 (ref 5.0–7.5)

## 2025-01-13 LAB — BLADDER SCAN AMB NON-IMAGING: PVR: 0 WU

## 2025-01-26 ENCOUNTER — Encounter: Admitting: Family

## 2025-03-24 ENCOUNTER — Encounter: Admitting: Family
